# Patient Record
Sex: Female | Born: 1990 | Race: White | Hispanic: No | Marital: Single | Smoking: Current every day smoker
Health system: Southern US, Community
[De-identification: ages and names within clinical notes are randomized; demographics above are authoritative.]

## PROBLEM LIST (undated history)

## (undated) ENCOUNTER — Inpatient Hospital Stay (HOSPITAL_COMMUNITY): Payer: Self-pay

## (undated) DIAGNOSIS — E669 Obesity, unspecified: Secondary | ICD-10-CM

## (undated) DIAGNOSIS — A599 Trichomoniasis, unspecified: Secondary | ICD-10-CM

## (undated) DIAGNOSIS — F329 Major depressive disorder, single episode, unspecified: Secondary | ICD-10-CM

## (undated) DIAGNOSIS — E039 Hypothyroidism, unspecified: Secondary | ICD-10-CM

## (undated) DIAGNOSIS — B009 Herpesviral infection, unspecified: Secondary | ICD-10-CM

## (undated) DIAGNOSIS — F319 Bipolar disorder, unspecified: Secondary | ICD-10-CM

## (undated) DIAGNOSIS — F1910 Other psychoactive substance abuse, uncomplicated: Secondary | ICD-10-CM

## (undated) DIAGNOSIS — F419 Anxiety disorder, unspecified: Secondary | ICD-10-CM

## (undated) DIAGNOSIS — F909 Attention-deficit hyperactivity disorder, unspecified type: Secondary | ICD-10-CM

## (undated) DIAGNOSIS — G2581 Restless legs syndrome: Secondary | ICD-10-CM

## (undated) DIAGNOSIS — F32A Depression, unspecified: Secondary | ICD-10-CM

## (undated) DIAGNOSIS — A749 Chlamydial infection, unspecified: Secondary | ICD-10-CM

## (undated) DIAGNOSIS — Z9189 Other specified personal risk factors, not elsewhere classified: Secondary | ICD-10-CM

## (undated) DIAGNOSIS — M419 Scoliosis, unspecified: Secondary | ICD-10-CM

## (undated) DIAGNOSIS — E282 Polycystic ovarian syndrome: Secondary | ICD-10-CM

## (undated) HISTORY — PX: LAPAROSCOPIC ABDOMINAL EXPLORATION: SHX6249

---
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
## 2008-02-07 ENCOUNTER — Ambulatory Visit (HOSPITAL_COMMUNITY): Payer: Self-pay | Admitting: Psychiatry

## 2008-02-14 ENCOUNTER — Ambulatory Visit (HOSPITAL_COMMUNITY): Payer: Self-pay | Admitting: Psychiatry

## 2008-06-13 ENCOUNTER — Ambulatory Visit (HOSPITAL_COMMUNITY): Admission: RE | Admit: 2008-06-13 | Discharge: 2008-06-13 | Payer: Self-pay | Admitting: Obstetrics and Gynecology

## 2010-02-04 ENCOUNTER — Ambulatory Visit (HOSPITAL_COMMUNITY): Payer: Self-pay

## 2010-04-13 LAB — CBC
HCT: 41.3 % (ref 36.0–46.0)
Hemoglobin: 14.1 g/dL (ref 12.0–15.0)
MCHC: 34.2 g/dL (ref 30.0–36.0)
MCV: 84.7 fL (ref 78.0–100.0)
Platelets: 266 10*3/uL (ref 150–400)
RBC: 4.88 MIL/uL (ref 3.87–5.11)
RDW: 13.1 % (ref 11.5–15.5)
WBC: 10.4 10*3/uL (ref 4.0–10.5)

## 2010-04-13 LAB — DIFFERENTIAL
Eosinophils Absolute: 0.1 10*3/uL (ref 0.0–0.7)
Eosinophils Relative: 1 % (ref 0–5)
Lymphocytes Relative: 22 % (ref 12–46)
Lymphs Abs: 2.3 10*3/uL (ref 0.7–4.0)
Monocytes Absolute: 0.7 10*3/uL (ref 0.1–1.0)
Monocytes Relative: 7 % (ref 3–12)

## 2010-04-13 LAB — COMPREHENSIVE METABOLIC PANEL
ALT: 18 U/L (ref 0–35)
AST: 16 U/L (ref 0–37)
Albumin: 4.2 g/dL (ref 3.5–5.2)
CO2: 30 mEq/L (ref 19–32)
Calcium: 10.3 mg/dL (ref 8.4–10.5)
GFR calc Af Amer: >60 mL/min (ref >60)
Sodium: 140 mEq/L (ref 135–145)
Total Protein: 8.3 g/dL (ref 6.0–8.3)

## 2010-05-19 NOTE — H&P (Signed)
[name], Alyssa Hansen                ACCOUNT NO.:  [id number]   MEDICAL RECORD NO.:  [id number]          PATIENT TYPE:  INP   LOCATION:  9168                          FACILITY:  [facility]   PHYSICIAN:  Zenaida Niece, M.D.DATE OF BIRTH:  01/15/1990   DATE OF ADMISSION:  04/28/2007  DATE OF DISCHARGE:                              HISTORY & PHYSICAL   CHIEF COMPLAINT:  Gestational hypertension.   HISTORY OF PRESENT ILLNESS:  This is an 20 year old white female gravida  1, para 0 with an EGA of 39+ weeks by a 9-week ultrasound with a due  date of May 02, 2007 who presents for induction due to gestational  hypertension.  The patient was seen in the office on April 27, 2007.  At  that time, blood pressure is 152/80.  She had trace proteinuria.  She  has no significant symptoms of preeclampsia.  Cervical exam in the  office was 1+, 40, -2 to -3, and vertex presentation.  Due to her  gestational hypertension and borderline favorable cervix, I elected to  proceed with induction.  Prenatal care has been complicated by an E coli  UTI at 35 weeks treated with Macrobid.  She has been on Wellbutrin for  depression.  At 37 weeks, she reported that she may have been exposed to  herpes.  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  She was seen at 36 weeks, and at that time  blood pressure was 150/94 and trace proteinuria on dip stick.  Dr.  Ambrose Mantle ordered labs which were essentially normal, except for low  potassium of 3.1.  She was put on oral potassium approximately a week  later, and levels have continued to be on the low side.  She was  instructed by Dr. Ambrose Mantle to do a 24-hour urine collection but left it at  home so never brought this.  She has not had any significant proteinuria  on dip stick in the office.   ALLERGIES:  AMOXICILLIN AND CECLOR.  SHE HAS A RASH WITH AMOXICILLIN AND  EDEMA WITH CECLOR.   PRENATAL LABS:  Blood type is A positive with  a negative antibody  screen.  RPR nonreactive, rubella immune, hepatitis B surface antigen  negative, HIV negative, gonorrhea and chlamydia negative.  TFTs are  normal.  Initial HSV antibodies in December were negative.  1-hour  Glucola is 95.  Group B strep is positive, sensitive to erythromycin and  clindamycin.   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   PAST MEDICAL HISTORY:  1. History of hypothyroidism.  2. Depression.   PAST SURGICAL HISTORY:  None.   SOCIAL HISTORY:  She is single and does admit to a small amount of  smoking.   FAMILY HISTORY:  Noncontributory.   REVIEW OF SYSTEMS:  Normal complaints of pregnancy.   PHYSICAL EXAMINATION:  VITAL SIGNS:  Weight is 267 pounds.  Again, blood  pressure on April 27, 2007 is 152/80.  GENERAL:  This is an obese  white female in no acute distress.  NECK:  Supple without lymphadenopathy or thyromegaly.  LUNGS:  Clear to auscultation.  HEART:  Regular rate and rhythm without murmur.  ABDOMEN:  Gravid with fundal height of 39 cm and an estimated fetal  weight of 8 pounds.  EXTREMITIES:  1+ edema and nontender.  PELVIC:  Cervix, again on April 27, 2007 is 1+. 40, -2 to -3, in a  vertex presentation and an adequate pelvis.   ASSESSMENT:  1. Intrauterine pregnancy at 39 weeks with gestational hypertension      and a borderline favorable cervix.  2. Group B strep positive with allergies to amoxicillin and Ceclor.      She has a rash with amoxicillin and edema with Ceclor.  3. Hypokalemia.   PLAN:  Admit the patient and induce with Pitocin.  I will recheck PIH  labs.  She will be put on clindamycin for group B strep prophylaxis.  We  will replace potassium as needed.      Zenaida Niece, M.D.  Electronically Signed     TDM/MEDQ  D:  04/28/2007  T:  04/28/2007  Job:  [id number]

## 2010-05-19 NOTE — Op Note (Signed)
NAME:  Alyssa Hansen, Alyssa Hansen                ACCOUNT NO.:  [id number]   MEDICAL RECORD NO.:  [id number]          PATIENT TYPE:  AMB   LOCATION:  SDC                           [facility]ILITY:  [facility]   PHYSICIAN:  Malachi Pro. Ambrose Mantle, M.D. DATE OF BIRTH:  05-19-1990   DATE OF PROCEDURE:  06/13/2008  DATE OF DISCHARGE:                               OPERATIVE REPORT   PREOPERATIVE DIAGNOSIS:  Chronic pelvic pain.  The patient relates to  the cesarean section that she had approximately a year ago.   POSTOPERATIVE DIAGNOSES:  Chronic pelvic pain.  The patient relates to  the cesarean section that she had approximately a year ago.  Chronic  pelvic pain involved dyspareunia when she was having intercourse and  tenderness on pelvic exam.  Sexually transmitted disease checks had been  negative.  Ultrasound showed no pelvic masses, ovarian architecture  consistent with polycystic ovarian syndrome.   OPERATION:  Diagnostic laparoscopy.   OPERATOR:  Malachi Pro. Ambrose Mantle, MD   ASSISTANT:  Zenaida Niece, MD   ANESTHESIA:  General.   PROCEDURE IN DETAILS:  The patient was brought to the operating room,  placed under satisfactory general anesthesia and placed in [place]  stirrups.  The very obese abdomen was prepped with Betadine solution.  Vulva, vagina and urethra were prepped.  The bladder was emptied with a  [place] catheter.  Exam revealed the uterus to feel slightly mid plane,  probably normal size.  The adnexa were free of masses.  Cervix was  grasped and a Hulka cannula was placed into the uterus and attached the  anterior cervical lip.  The abdomen was then draped as a sterile field.  Because of the patient's quite obese abdomen, I elected to try enter the  abdominal cavities supraumbilically.  I made a transverse semilunar  incision above the umbilicus, carried in layers down to one I felt was  the fascia, incised that, got into the muscle and I was trying to enter  the abdominal cavity with my finger, but  could not do so.  I actually  asked Dr. Jackelyn Knife to come to the operating room to see if he could use  the OptiVu, but I did identify the fascia at this point and was able to  enter the peritoneal cavity.  I then used a Urology needle to  circumferentially suture the fascia.  I then placed the Hasson cannula  into the abdominal cavity, fixed the sutures to the grooves on the  cannula, and established a pneumoperitoneum.  There was not a perfect  seal, there was some leakage, but I was able to develop enough of a  pneumoperitoneum to see the pelvis clearly.  With the aid of  Trendelenburg position and manipulation of the uterus with the Hulka  cannula and placing an additional trocar suprapubically under direct  vision and using a blunt probe, I was able to visualize the pelvis in  its entirety.  Starting with the posterior cul-de-sac, there was no  evidence of any scarring or endometriosis.  There was no adherence of  any structure in the posterior cul-de-sac.  The uterus appeared  completely normal.  The anterior cul-de-sac was normal.  The right tube  and ovary were completely normal.  There was no adherence of the right  ovary to the posterior cul-de-sac or to any other structure.  The  fimbriated end of the right tube was completely normal.  The left  fimbria was normal as was the left ovary.  There was no adherence of  structures to the posterior broad ligament.  I looked superiorly.  The  liver edge was smooth.  I did not see the gallbladder.  I did not see  any upper abdominal abnormalities.  I removed the lower abdominal trocar  from the site and saw no bleeding.  I then released the Hulka cannula  and then closed these sutures that had been circumferentially through  the fascia and then I put an additional figure-of-eight suture of 0  Vicryl through the fascia.  I then closed the subcu tissue with 3-0  Vicryl and closed the skin with interrupted sutures  of 3-0 plain catgut.   The lower abdominal site was reapproximated with  Steri-Strips.  The patient seemed to tolerate the procedure well.  Blood  loss was about 25 mL.  Sponge and needle counts were correct.  The Hulka  cannula was removed and the patient was returned to recovery in  satisfactory condition.      Malachi Pro. Ambrose Mantle, M.D.  Electronically Signed     TFH/MEDQ  D:  06/13/2008  T:  06/13/2008  Job:  [id number]

## 2010-05-19 NOTE — Op Note (Signed)
Alyssa Hansen, Alyssa Hansen                ACCOUNT NO.:  [id number]   MEDICAL RECORD NO.:  [id number]          PATIENT TYPE:  INP   LOCATION:  9168                          [facility]ILITY:  [facility]   PHYSICIAN:  Leighton Roach Meisinger, M.D.DATE OF BIRTH:  07-01-1990   DATE OF PROCEDURE:  04/29/2007  DATE OF DISCHARGE:                               OPERATIVE REPORT   PREOPERATIVE DIAGNOSES:  Intrauterine pregnancy at 39 weeks, gestational  hypertension, hypokalemia and arrest of dilation.   POSTOPERATIVE DIAGNOSES:  Intrauterine pregnancy at 39 weeks,  gestational hypertension, hypokalemia and arrest of dilation.   SURGERY:  Primary low transverse cesarean section without extensions.   SURGEON:  Zenaida Niece, M.D.   ANESTHESIA:  Epidural.   SPECIMENS:  Placenta sent to pathology.   ESTIMATED BLOOD LOSS:  1000 mL.   FINDINGS:  The patient had normal gravid anatomy.   COMPLICATIONS:  None.   PROCEDURE IN DETAIL:  The patient was taken back to the operating room  and placed in the dorsal supine position with left lateral tilt.  Her  previously placed epidural was dosed appropriately.  The abdomen was  prepped and draped in the usual sterile fashion and a Foley catheter had  previously been placed.  The level of his anesthesia was found to be  adequate and the abdomen was entered via a standard Pfannenstiel  incision.  Once the peritoneal cavity was entered, the Alexis disposable  self-retaining retractor was placed and the lower uterine segment was  well exposed.  A 4 cm transverse incision was made in the lower uterine  segment, pushing the bladder inferior.  Once the uterus was entered, the  incision was extended digitally. Membranes were ruptured revealing  meconium stained amniotic fluid.  The fetal vertex was grasped and  delivered through the incision atraumatically.  Mouth and nares were  suctioned with the bulb.  The remainder of the infant then delivered  atraumatically.  Cord was  doubly clamped and cut and the infant handed  to the awaiting pediatric team.  Placenta was then delivered  spontaneously and sent for cord blood collection and then to pathology.  The uterus was wiped dry with a clean lap pad and all clots and debris  removed.  Uterine incision was inspected  and found to be free of  extensions.  Uterine incision was closed in 2 layers, with the first  layer being in a running locking layer with #1 chromic and the second  layer being an imbricating layer also with #1 chromic.  A small amount  of bleeding from the right side was controlled with another figure-of-  eight suture of #1 chromic.  Tubes and ovaries were inspected and found  to be normal.  Pericolic gutters were blotted and all clots and debris  removed.  Uterine incision was again inspected and found to be  hemostatic.  Subfascial space was then irrigated and made hemostatic  with electrocautery.  The fascia was closed in a running fashion  starting at both ends and meeting in the middle with 0 Vicryl.  Subcutaneous tissue was irrigated and made  hemostatic with  electrocautery.  Subcutaneous tissue was then closed with running 2-0  plain gut suture.  The skin  was then closed with staples followed by a sterile dressing.  The  patient tolerated the procedure well and was taken to the recovery room  in stable condition.  Counts were correct x2 and she received  clindamycin 900 mg IV at the beginning of the procedure.      Zenaida Niece, M.D.  Electronically Signed     TDM/MEDQ  D:  04/29/2007  T:  04/29/2007  Job:  [id number]

## 2010-05-19 NOTE — Discharge Summary (Signed)
Alyssa Hansen, Alyssa Hansen                ACCOUNT NO.:  [id number]   MEDICAL RECORD NO.:  [id number]          PATIENT TYPE:  INP   LOCATION:  9121                          [facility]   PHYSICIAN:  Zenaida Niece, M.D.DATE OF BIRTH:  [birth year]   DATE OF ADMISSION:  04/28/2007  DATE OF DISCHARGE:  05/02/2007                               DISCHARGE SUMMARY   ADMISSION DIAGNOSES:  Intrauterine pregnancy at 39 weeks, gestational  hypertension, group B strep carrier, and hypokalemia.   DISCHARGE DIAGNOSES:  Intrauterine pregnancy at 39 weeks, gestational  hypertension, group B strep carrier, hypokalemia, postpartum anemia, and  arrest of dilation.   HISTORY AND PHYSICAL:  Please see chart for full history and physical.  Briefly, this is an 20 year old white female, gravida 1, para 0, with an  EGA of [redacted] weeks, who was admitted for induction due to gestational  hypertension.   PAST MEDICAL HISTORY:  Significant for history of hypothyroidism and  depression.   ALLERGIES:  She is allergic to AMPICILLIN and CECLOR and her group B  strep is sensitive to CLINDAMYCIN and ERYTHROMYCIN.   PHYSICAL EXAMINATION:  Weight is 267 pounds and blood pressure is  152/80.  ABDOMEN:  Obese, gravid with a fundal height of 39 cm.  Cervix is 1+,  40, -2, -3, vertex presentation.   HOSPITAL COURSE:  The patient was admitted and initially started on  Pitocin.  She was also given IV potassium and put on clindamycin for  group B strep prophylaxis.  Throughout the day, on April 24 , 2009, she  did develop some contractions, but cervix did not change.  On the  evening of April 28, 2007, the Pitocin was turned off and she had the  Cytotec placed and was given Ambien.  On the morning of April 29, 2007,  cervix was 1+, 50, -2, and -3 and I was able to rupture membranes, which  revealed moderate to thick meconium.  Potassium on April 29, 2007, was  3.0.  She was given more IV potassium, restarted on Pitocin, and  continued on clindamycin.  She progressed to 4 cm throughout the day,  but by 8:45 in the evening had not progressed past 4 cm and had been 4  cm for 5-6 hours.  Thus, on the evening of April 29, 2007, she had a  primary low-transverse cesarean section under epidural anesthesia.  Estimated blood loss was 1000 mL.  She delivered a viable female with  Apgars of 9 and 9 that weighed 7 pounds.  Postoperatively, she had no  significant complications.  Pre-delivery hemoglobin was 9.4 and on  postop day #1 is 7.0.  Potassium on postpartum day #1 was 3.2.  She was  continued on p.o. potassium.  On postpartum day #2, hemoglobin fell to  6.5 and potassium was stable at 3.1.  On postop day #3, hemoglobin was  stable at 6.7 and potassium had increased to 3.5 on p.o.  supplementation.  Blood pressures remained stable after delivery.  On  postop day #3, she was felt to be stable enough for discharge home.  At  this time, her incision was healing well and her staples were removed  and Steri-Strips was applied.   DISCHARGE INSTRUCTIONS:  Regular diet, pelvic rest, and no strenuous  activity.   FOLLOWUP:  Followup is in 2 weeks for an incision check.   MEDICATIONS:  K-Dur 20 mEq b.i.d. until the prescription she has at home  has gone, over-the-counter iron b.i.d., Percocet #40 1 to 2 p.o. q.4-6  h. p.r.n. pain, and over-the-counter ibuprofen as needed.  She was also  given our discharge pamphlet.      Zenaida Niece, M.D.  Electronically Signed     TDM/MEDQ  D:  05/02/2007  T:  05/03/2007  Job:  [id number]

## 2010-05-22 NOTE — Discharge Summary (Signed)
Alyssa Hansen, FRARY NO.:  [id number]   MEDICAL RECORD NO.:  [id number]          PATIENT TYPE:  INP   LOCATION:  0101                          [facility]ILITY:  [facility]   PHYSICIAN:  Lalla Brothers, MDDATE OF BIRTH:  [birth year]   DATE OF ADMISSION:  02/15/2006  DATE OF DISCHARGE:  02/20/2006                               DISCHARGE SUMMARY   IDENTIFICATION:  A 50-1/22-year-old female 12th grade student at  [employer] was admitted emergently voluntarily when  brought by mother for inpatient stabilization and treatment of suicide  risk and depression.  The patient has been missing school sometimes  because of intoxication with cannabis and was getting behind on her  work.  She became upset at school after not being able to take a test  because she did not attend to the material, that she tends to blame  mother even though she skipped school the day before.  She sees Dr.  Lucianne Muss for psychiatric care and Gretta Arab at [facility].  For full details, please see the typed admission assessment.   SYNOPSIS OF PRESENT ILLNESS:  The patient is angry on admission and the  mother seemed fearful that the patient would hurt mother.  Patient is  threatening to overdose.  She has long history of ADHD and interprets  herself that she has bipolar disorder currently.  She reports a blunt of  cannabis twice weekly at the time of admission and a pint of liquor  every 3-4 days as well as a pack per day of cigarettes.  She is allergic  to AMOXICILLIN and CECLOR.  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  At the time of admission, she is taking metformin 1500 mg XR  daily, Levoxyl 0.5 mg daily, Concerta 36 mg daily, Wellbutrin 150 mg XL  daily and trazodone 50 mg at bedtime.  She sleeps excessively and is  irritable and manipulative, demanding relief that she states can only  be  obtained by released from the hospital.  She may be missing up to 3 days  of school weekly and may have legal consequences soon.   INITIAL MENTAL STATUS EXAM:  The patient was hypersensitive to the  comments or actions of others and over reacts in anger as well as by  overeating with poor impulse control.  She projects responsibility to  mother, stating that she considers mother depressed and suicidal  herself.  There are no psychotic or manic symptoms.  She has no post-  traumatic dissociation or re-experiencing.  She has a suicide plan to  overdose but denies homicidality and she minimizes the suicidality as  though it is mother's perception.   LABORATORY FINDINGS:  CBC on admission was normal with white count 9400,  hemoglobin 12.7, MCV of 83 and platelet count 230,000.  Basic metabolic  panel was normal except BUN low at 4 with lower limit of normal 6.  Sodium was normal at 137, potassium 3.6, fasting glucose 81, creatinine  0.64 and calcium 9.  Free  T4 was normal at 1.18 and TSH at 2.945.  Hepatic function panel was normal except albumin low at 3.1 with lower  limit of normal 3.5 with GGT normal at 32, SGOT 18 and SGPT 19.  RPR and  HIV were nonreactive.  Urine HCG was negative.  Urinalysis was normal  with specific gravity of 1.018 with moderate leukocyte esterase, 3-6  WBC, 0-2 RBC, few bacteria and many epithelial suggesting a poor clean  catch.  Urine probe for gonorrhea and chlamydia trichomatous by DNA  amplification were both negative.  Urine drug screen was positive for  marijuana metabolite and benzodiazepine with creatinine of 148 mg/dL  documenting adequate specimen with confirmation and quantitation of  marijuana at 28 ng/mL and benzodiazepine as alphahydroxy alprazolam at  470 ng/mL confirming that the patient had taken Xanax.  The patient's  capillary blood glucose monitoring for 48 hours was normal with maximum  value of 118 just before supper.  On the day of  February 18, 2006, her  fasting CBG was 86 and the preceding day, her a.c. lunch was 85, a.c.  supper 81 and bedtime CBG 83 mg/dL.   HOSPITAL COURSE AND TREATMENT:  General medical exam by Jorje Guild PA-C  noted one-pack per day of cigarettes for 4 years and the patient  acknowledged using Xanax twice.  Has a paternal uncle with  schizophrenia.  The patient had menarche at age 51 with a regular menses  and now diagnosis of polycystic ovaries.  She is overweight.  Last GYN  exam was September 2007.  She had fallen prior to admission with a  contusion and slight skin tear to the upper lip treated with ice pack.  She is allergic to CECLOR and AMOXICILLIN.  Vital signs were otherwise  normal throughout hospital stay.  Admission height was 64-1/2 inches and  weight 112 kg.  Initial blood pressure was 116/75 with heart rate of 92.  At the time of discharge, supine blood pressure was 119/68 with heart  rate of 88 and standing blood pressure 153/77 with heart rate of 25.  On  the day prior to discharge, supine blood pressure was 126/74 with heart  rate of 91 and standing blood pressure 118/76 with heart rate of 131.  The patient was initially resistant to participation in treatment for  the first two hospital days.  She joined in somewhat subsequently while  maintaining that she did not need to be in the hospital and that she her  mother wanted her discharged.  Mother did sign a parental demand for  discharge stating that mother considers the patient to have adequate  outpatient treatment to handle current symptoms and does not need to be  in the hospital.  The patient did participate in the hospital treatment  program though denying that she was learning anything.  She maintained  that she is not suicidal but only depressed.  She suggests that her  depression gets worse in her teenage years.  She was discharged 3 days early at mother's demand, taking every effort possible to prepare she  and  mother for discharge.  She required no seclusion or restraint during  the hospital stay.  She had missed 23 days of school last semester  requiring modifications and now is decompensating again over the last 2  weeks.  Expectation for return to school was advanced and preparations  made for such.   FINAL DIAGNOSES:  AXIS I:  1. Major depression, recurrent, severe with atypical features.  2. Attention deficit hyperactivity disorder, combined type, moderate      severity.  3. Cannabis abuse.  4. Alcohol abuse.  5. Parent/child problem.  6. Other specified family circumstances.  7. Other interpersonal problems.  AXIS II: Diagnosis deferred.  AXIS III:  1. Obesity.  2. Polycystic ovarian syndrome.  3. Hypothyroidism.  4. Allergy to amoxicillin and Ceclor.  5. Cigarette smoking  6. Contusion and abrasion upper lip.  AXIS IV: Stressors family severe acute and chronic; phase of life severe  acute and chronic; school moderate acute and chronic; medical moderate  acute and chronic.  AXIS V: GAF on admission 37 with highest in last year 63 and discharge  GAF was 47.   PLAN:  The patient was discharged on her weight control metabolic  syndrome diet having no restrictions on physical activity other than to  abstain from cannabis, alcohol and other illicit drugs.  Lips are well  healed.  She has no wound care instructions.  Crisis and safety plans  are outlined if needed.  She is discharged on the following medication:  1. Concerta 36 mg every morning a month's supply.  2. Wellbutrin increased to 300 mg XL every morning prescribed a      month's supply.  3. Trazodone 50 mg every bedtime a month's supply prescribed.  4. Levothyroxine 15 mcg daily having her own home supply.  5. Metformin 1500 mg XL daily having her own home supply.   They were re-educated on medications.  She will see Dr. Lucianne Muss February 23, 2006 at 1400 for psychiatric follow-up.  She will see Gretta Arab  February 23, 2006 at 1500 for therapy.      Lalla Brothers, MD  Electronically Signed     GEJ/MEDQ  D:  02/26/2006  T:  02/27/2006  Job:  [id number]   cc:   Reather Littler, M.D.  Fax: [phone]   Gretta Arab  [facility]  [address]  [place], [place] [address]  Fax [phone]

## 2010-05-22 NOTE — H&P (Signed)
Alyssa Hansen, Alyssa Hansen NO.:  [id number]   MEDICAL RECORD NO.:  [id number]          PATIENT TYPE:  INP   LOCATION:  0101                          FACILITY:  BH   PHYSICIAN:  Lalla Brothers, MDDATE OF BIRTH:  Nov 28, 1990   DATE OF ADMISSION:  02/15/2006  DATE OF DISCHARGE:                       PSYCHIATRIC ADMISSION ASSESSMENT   IDENTIFICATION:  This 57-1/20-year-old female, 12th grade student at  [employer], is admitted emergently voluntarily when  brought by mother for inpatient stabilization and treatment of suicide  risk and depression.  Mother documented that the patient made suicide  statements to overdose but did not do so.  The patient has been sad and  angry with progressive consequences as she gets behind at school and  blames mother or others.  The patient suggested that it was four weeks  ago that she was using cannabis at home and mother is concerned about  intoxication.   HISTORY OF PRESENT ILLNESS:  The patient has seen Dr. Betti Cruz at [facility] apparently in October of 2007.  He was  subsequently transferred to Dr. Lucianne Muss and seen her for one visit.  Since  October of 2007, the patient has been in therapy with Gretta Arab, [phone], every other week for therapy.  The patient offers little  formulation of the origin and sustaining factors for symptoms.  She  suggests she has only a few friends and that some are supportive or  helpful though few.  The patient states the school thought she was sick  on the day of admission.  She had skipped school the day before and was  attempting to avoid the test she was to take on the day of admission as  she knew none of the answers to questions.  The patient cries as she  states that these problems are not of much significance.  She is  underachieving in school, particularly last year, having to have a  shortened school day but barely passing.  The patient does not  believe  that the school suspected intoxication such as with cannabis.  Mother  states that she is afraid the patient will harm herself at the time of  crisis assessment.  The patient is angry that mother seemed fearful that  the patient would hurt mother.  The patient smokes one pack per day of  cigarettes.  She has a blunt of cannabis twice weekly and reports having  a pint of liquor every 3-4 days with last use of cannabis two weeks ago  and alcohol last weekend.  She smokes one pack per day of cigarettes.  The patient denies organic central nervous system trauma.  She does not  acknowledge psychic trauma and post-traumatic stress.  She does not  describe dissociation.  The patient does not describe definite manic  symptoms but she suggests that she needs assess for a manic diathesis.   PAST MEDICAL HISTORY:  The patient is under the primary care of Dr. Elpidio Eric.  The patient reports having hypothyroidism and borderline  diabetes mellitus.  She reports having polycystic ovary  syndrome.  Last  GYN exam however was one year ago.  She reports abrasions and edema on  both feet.  The patient reports being sexually active but is uncertain  of last menses.  She is allergic to AMOXICILLIN and CECLOR.  She denies  seizure or syncope.  She denies heart murmur or arrhythmia.   REVIEW OF SYSTEMS:  The patient denies difficulty with gait, gaze or  continence at this time.  She denies exposure to communicable disease or  toxins.  She has no current headache or sensory loss.  There is no  memory loss or coordination deficit.  There is no rash, jaundice or  purpura currently.  There is no abdominal pain, nausea, vomiting or  diarrhea.  There is no dysuria or arthralgia.   IMMUNIZATIONS:  Up-to-date.   FAMILY HISTORY:  The patient resides with mother whom the patient  describes as depressed and making suicide threats.  There is an uncle  with schizophrenia.  The patient will not disclose  more about the family  history.  She has a long history of ADHD symptoms and now believes that  she has bipolar disorder in the course of her care thus far.  She offers  no other elaboration on the symptoms of the uncle with schizophrenia.   SOCIAL AND DEVELOPMENTAL HISTORY:  The patient is a 12th grade student  at [employer].  She walked out of class,  refusing to do her test or go back on the day of admission.  She has  excessive sleep and overall atypical depressive features.  She is  irritable and manipulative, demanding relief and suggesting relief can  only be obtained by release from the hospital.  The patient is  circumstantial and self-defeating.  She denies legal consequences at  this time.  She is apparently missing about three days of school weekly  and therefore likely to have legal consequences soon.  She was  apparently given a reduced curriculum last year in order to pass at  school.  She does not acknowledge other employment or extracurricular  activities.  She seems enmeshed with mother but at the same time  defeating of both.  She acknowledges sexual activity.   ASSETS:  The patient verbally participates until she begins to make  threats.   MENTAL STATUS EXAM:  Height is 64-1/2 inches and weight is 112 kg.  Blood pressure is 116/75 with heart rate of 92.  She is right-handed.  She is alert and oriented with speech intact.  Cranial nerves are  intact.  Muscle strengths and tone are normal.  There are no pathologic  reflexes or soft neurologic findings.  There are no abnormal involuntary  movements.  Gait and gaze are intact.  Mood is moderately to severely  dysphoric with atypical and reactive dysphoric features.  The patient is  hypersensitive to the comments or actions of others and overreacts with  poor impulse control.  She appears to overeat and have outbursts of anger.  She approaches hospitalization in the same way, projecting   responsibility to mother as though mother is depressed and suicidal.  The patient minimizes her destructiveness and difficulty accepting  communication.  She becomes more depressed and angry as progressive  consequences occur in a self-defeating and self-sustaining pattern.  She  does not manifest psychotic, manic or post-traumatic dissociative or  reexperiencing symptoms at this time.  She has suicidal ideation and a  plan to overdose.  She is not homicidal.  ADMISSION MEDICATIONS:  1. Wellbutrin 150 mg XL every morning.  2. Concerta 36 mg every morning.  3. Trazodone 50 mg at bedtime if needed for insomnia.  4. Levoxyl 0.05 mg every morning.  5. Metformin 1500 mg ER every morning.   IMPRESSION:  AXIS I:  Major depression, recurrent, severe with atypical  features.  Attention-deficit hyperactivity disorder, combined-type,  moderate severity.  Cannabis abuse.  Alcohol abuse.  Rule out  generalized anxiety disorder (provisional diagnosis).  Other  interpersonal problem.  Parent-child problem.  Other specified family  circumstances.  AXIS II:  Diagnosis deferred.  AXIS III:  Obesity, history of polycystic ovaries disorder by history,  hypothyroidism by history, prediabetes mellitus by history, allergy to  AMOXICILLIN and CECLOR, cigarette smoking.  AXIS IV:  Stressors:  Family--severe, acute and chronic; phase of life--  severe, acute and chronic; school--moderate, acute and chronic; medical-  -moderate, acute and chronic.  AXIS V:  GAF on admission 37; highest in last year estimated at 63.   PLAN:  The patient is admitted for inpatient adolescent psychiatric and  multidisciplinary multimodal behavioral health treatment in a team-based  program at a locked psychiatric unit.  Will increase Wellbutrin to 300  mg XL every morning and continue Concerta 36 mg every morning and as  needed trazodone at bedtime.  Cognitive behavioral therapy, anger  management, family therapy, substance  abuse intervention, social and  communication skills, problem-solving and coping skills, habit reversal,  individuation separation and identity consolidation as well as learning  strategies can be undertaken.   ESTIMATED LENGTH OF STAY:  Seven days with target symptoms for discharge  being stabilization of suicide risk and mood, stabilization of  disruptive behavior, dangerous to self and others and generalization of  the capacity for safe, effective participation in outpatient treatment.      Lalla Brothers, MD  Electronically Signed     GEJ/MEDQ  D:  02/16/2006  T:  02/16/2006  Job:  [id number]

## 2010-09-29 LAB — CBC
HCT: 19.8 — ABNORMAL LOW
HCT: 20 — ABNORMAL LOW
HCT: 26.4 — ABNORMAL LOW
HCT: 27.1 — ABNORMAL LOW
HCT: 28 — ABNORMAL LOW
Hemoglobin: 6.5 — CL
Hemoglobin: 7 — CL
Hemoglobin: 9.4 — ABNORMAL LOW
Hemoglobin: 9.6 — ABNORMAL LOW
MCHC: 32.4
MCHC: 33.5
MCHC: 33.6
MCHC: 34.6
MCV: 78.4
MCV: 78.4
MCV: 79.3
Platelets: 246
Platelets: 252
Platelets: 267
Platelets: 268
RBC: 2.48 — ABNORMAL LOW
RBC: 2.5 — ABNORMAL LOW
RDW: 13.9
RDW: 14.3
RDW: 14.4
RDW: 14.4
WBC: 13.6 — ABNORMAL HIGH
WBC: 17 — ABNORMAL HIGH

## 2010-09-29 LAB — COMPREHENSIVE METABOLIC PANEL
ALT: 8
ALT: <8
AST: 10
Albumin: 1.5 — ABNORMAL LOW
Albumin: 1.9 — ABNORMAL LOW
Albumin: 2 — ABNORMAL LOW
Alkaline Phosphatase: 123 — ABNORMAL HIGH
Alkaline Phosphatase: 131 — ABNORMAL HIGH
BUN: 2 — ABNORMAL LOW
BUN: 4 — ABNORMAL LOW
Calcium: 7.9 — ABNORMAL LOW
Chloride: 107
Creatinine, Ser: 0.55
GFR calc Af Amer: >60
Glucose, Bld: 120 — ABNORMAL HIGH
Glucose, Bld: 81
Potassium: 3 — ABNORMAL LOW
Potassium: 3.2 — ABNORMAL LOW
Sodium: 137
Sodium: 139
Total Bilirubin: 0.2 — ABNORMAL LOW
Total Bilirubin: 0.3
Total Protein: 4.1 — ABNORMAL LOW
Total Protein: 5.2 — ABNORMAL LOW
Total Protein: 5.5 — ABNORMAL LOW

## 2010-09-29 LAB — URIC ACID
Uric Acid, Serum: 4.8
Uric Acid, Serum: 5
Uric Acid, Serum: 5.4

## 2010-09-29 LAB — URINALYSIS, DIPSTICK ONLY
Glucose, UA: NEGATIVE
Ketones, ur: NEGATIVE
Nitrite: POSITIVE — AB
Protein, ur: NEGATIVE

## 2010-09-29 LAB — RPR: RPR Ser Ql: NONREACTIVE

## 2010-09-29 LAB — LACTATE DEHYDROGENASE
LDH: 139
LDH: 83 — ABNORMAL LOW

## 2011-02-05 ENCOUNTER — Other Ambulatory Visit: Payer: Self-pay | Admitting: Gynecology

## 2012-02-11 ENCOUNTER — Other Ambulatory Visit: Payer: Self-pay

## 2012-04-14 ENCOUNTER — Encounter (HOSPITAL_COMMUNITY): Payer: Self-pay

## 2012-04-14 ENCOUNTER — Inpatient Hospital Stay (HOSPITAL_COMMUNITY)
Admission: AD | Admit: 2012-04-14 | Discharge: 2012-04-15 | Disposition: A | Discharge status: Other Acute Inpatient Hospital | Payer: BC Managed Care – PPO | Source: Ambulatory Visit | Attending: Obstetrics and Gynecology | Admitting: Obstetrics and Gynecology

## 2012-04-14 DIAGNOSIS — F1920 Other psychoactive substance dependence, uncomplicated: Secondary | ICD-10-CM | POA: Insufficient documentation

## 2012-04-14 DIAGNOSIS — F1120 Opioid dependence, uncomplicated: Secondary | ICD-10-CM | POA: Insufficient documentation

## 2012-04-14 HISTORY — DX: Restless legs syndrome: G25.81

## 2012-04-14 HISTORY — DX: Scoliosis, unspecified: M41.9

## 2012-04-14 HISTORY — DX: Major depressive disorder, single episode, unspecified: F32.9

## 2012-04-14 HISTORY — DX: Anxiety disorder, unspecified: F41.9

## 2012-04-14 HISTORY — DX: Attention-deficit hyperactivity disorder, unspecified type: F90.9

## 2012-04-14 HISTORY — DX: Depression, unspecified: F32.A

## 2012-04-14 HISTORY — DX: Bipolar disorder, unspecified: F31.9

## 2012-04-14 LAB — URINALYSIS, ROUTINE W REFLEX MICROSCOPIC
Bilirubin Urine: NEGATIVE
Glucose, UA: NEGATIVE mg/dL
Hgb urine dipstick: NEGATIVE
Specific Gravity, Urine: >1.03 — ABNORMAL HIGH (ref 1.005–1.030)
pH: 6 (ref 5.0–8.0)

## 2012-04-14 LAB — COMPREHENSIVE METABOLIC PANEL
AST: 24 U/L (ref 0–37)
CO2: 28 mEq/L (ref 19–32)
Calcium: 9.4 mg/dL (ref 8.4–10.5)
Chloride: 101 mEq/L (ref 96–112)
Creatinine, Ser: 0.62 mg/dL (ref 0.50–1.10)
GFR calc Af Amer: >90 mL/min (ref >90)
GFR calc non Af Amer: >90 mL/min (ref >90)
Glucose, Bld: 104 mg/dL — ABNORMAL HIGH (ref 70–99)
Total Bilirubin: 0.2 mg/dL — ABNORMAL LOW (ref 0.3–1.2)

## 2012-04-14 LAB — CBC
HCT: 34.6 % — ABNORMAL LOW (ref 36.0–46.0)
Hemoglobin: 11.5 g/dL — ABNORMAL LOW (ref 12.0–15.0)
MCH: 27.3 pg (ref 26.0–34.0)
MCV: 82.2 fL (ref 78.0–100.0)
RBC: 4.21 MIL/uL (ref 3.87–5.11)
WBC: 10.9 10*3/uL — ABNORMAL HIGH (ref 4.0–10.5)

## 2012-04-14 LAB — RAPID URINE DRUG SCREEN, HOSP PERFORMED: Opiates: POSITIVE — AB

## 2012-04-14 NOTE — MAU Provider Note (Addendum)
History     CSN: [id number]  Arrival date and time: 04/14/12 1707   None     No chief complaint on file.  HPI 22 y.o. G2P1001 at [redacted]w[redacted]d requesting detox from oxycodone. "trying to get off of it for 5 years". Has been injecting between 60 and 200 mg oxycodone daily. Also snorts pills. Not using alcohol. Does smoke cigarettes. States she is ready to stop using.   Past Medical History  Diagnosis Date  . Scoliosis   . Restless legs   . Bipolar 1 disorder, depressed   . Anxiety   . Depression   . ADHD (attention deficit hyperactivity disorder)   . ADHD (attention deficit hyperactivity disorder)     Past Surgical History  Procedure Laterality Date  . Cesarean section    . Laparoscopic abdominal exploration      Family History  Problem Relation Age of Onset  . Alcohol abuse Neg Hx   . Arthritis Neg Hx   . Asthma Neg Hx   . Birth defects Neg Hx   . Cancer Neg Hx   . Depression Neg Hx   . COPD Neg Hx   . Drug abuse Neg Hx   . Early death Neg Hx   . Diabetes Neg Hx   . Hearing loss Neg Hx     History  Substance Use Topics  . Smoking status: Current Every Day Smoker  . Smokeless tobacco: Not on file  . Alcohol Use: No    Allergies:  Allergies  Allergen Reactions  . Amoxicillin   . Cefaclor     No prescriptions prior to admission    Review of Systems  Constitutional: Negative.   Respiratory: Negative.   Cardiovascular: Negative.   Gastrointestinal: Negative for nausea, vomiting, abdominal pain, diarrhea and constipation.  Genitourinary: Negative for dysuria, urgency, frequency, hematuria and flank pain.       Negative for vaginal bleeding, cramping/contractions  Musculoskeletal: Negative.   Neurological: Negative.   Psychiatric/Behavioral: Negative.    Physical Exam   Blood pressure 122/65, pulse 90, temperature 97.9 F (36.6 C), temperature source Oral, resp. rate 20, height 5\' 4"  (1.626 m), weight 282 lb 3.2 oz (128.005 kg), SpO2 96.00%.  Physical  Exam  Nursing note and vitals reviewed. Constitutional: She is oriented to person, place, and time. She appears well-developed and well-nourished. No distress.  Obese   Cardiovascular: Normal rate.   Respiratory: Effort normal.  GI: Soft. There is no tenderness.  Musculoskeletal: Normal range of motion.  Neurological: She is alert and oriented to person, place, and time.  Skin: Skin is warm and dry.  Multiple injection sites on arms and on right breast, none with discharge or erythema  Psychiatric: She has a normal mood and affect. Her behavior is normal.    MAU Course  Procedures  Results for orders placed during the hospital encounter of 04/14/12 (from the past 24 hour(s))  URINE RAPID DRUG SCREEN (HOSP PERFORMED)     Status: Abnormal   Collection Time    04/14/12  5:15 PM      Result Value Range   Opiates POSITIVE (*) NONE DETECTED   Cocaine POSITIVE (*) NONE DETECTED   Benzodiazepines NONE DETECTED  NONE DETECTED   Amphetamines NONE DETECTED  NONE DETECTED   Tetrahydrocannabinol NONE DETECTED  NONE DETECTED   Barbiturates NONE DETECTED  NONE DETECTED  URINALYSIS, ROUTINE W REFLEX MICROSCOPIC     Status: Abnormal   Collection Time    04/14/12  5:59 PM  Result Value Range   Color, Urine YELLOW  YELLOW   APPearance HAZY (*) CLEAR   Specific Gravity, Urine >1.030 (*) 1.005 - 1.030   pH 6.0  5.0 - 8.0   Glucose, UA NEGATIVE  NEGATIVE mg/dL   Hgb urine dipstick NEGATIVE  NEGATIVE   Bilirubin Urine NEGATIVE  NEGATIVE   Ketones, ur NEGATIVE  NEGATIVE mg/dL   Protein, ur NEGATIVE  NEGATIVE mg/dL   Urobilinogen, UA 0.2  0.0 - 1.0 mg/dL   Nitrite NEGATIVE  NEGATIVE   Leukocytes, UA NEGATIVE  NEGATIVE  CBC     Status: Abnormal   Collection Time    04/14/12 10:26 PM      Result Value Range   WBC 10.9 (*) 4.0 - 10.5 K/uL   RBC 4.21  3.87 - 5.11 MIL/uL   Hemoglobin 11.5 (*) 12.0 - 15.0 g/dL   HCT 40.9 (*) 81.1 - 91.4 %   MCV 82.2  78.0 - 100.0 fL   MCH 27.3  26.0 - 34.0  pg   MCHC 33.2  30.0 - 36.0 g/dL   RDW 78.2  95.6 - 21.3 %   Platelets 193  150 - 400 K/uL  COMPREHENSIVE METABOLIC PANEL     Status: Abnormal   Collection Time    04/14/12 10:26 PM      Result Value Range   Sodium 138  135 - 145 mEq/L   Potassium 3.4 (*) 3.5 - 5.1 mEq/L   Chloride 101  96 - 112 mEq/L   CO2 28  19 - 32 mEq/L   Glucose, Bld 104 (*) 70 - 99 mg/dL   BUN 5 (*) 6 - 23 mg/dL   Creatinine, Ser 0.86  0.50 - 1.10 mg/dL   Calcium 9.4  8.4 - 57.8 mg/dL   Total Protein 6.3  6.0 - 8.3 g/dL   Albumin 2.5 (*) 3.5 - 5.2 g/dL   AST 24  0 - 37 U/L   ALT 16  0 - 35 U/L   Alkaline Phosphatase 63  39 - 117 U/L   Total Bilirubin 0.2 (*) 0.3 - 1.2 mg/dL   GFR calc non Af Amer >90  >90 mL/min   GFR calc Af Amer >90  >90 mL/min     Assessment and Plan  SW consult ordered Dr. Ellyn Hack consulted psychiatry at [facility] - they are sending ACT team to eval if patient meets criteria for inpatient detox Care assumed by Dorathy Kinsman, CNM  [facility] 04/14/2012, 6:06 PM   Ivonne Andrew, CNM assumed care of pt at 2000. Awaiting ACT eval.   MAU Course  2200: Dr. Ellyn Hack at [facility]. Pt requesting [place]. Informed her that there is no emergent condition requiring one. Anatomy scan will be scheduled.  0200: RN informed by ACT that pt will be accepted at [facility], but not until 0330 or 0400.  0400: [facility] (w/ ACT) on unit arranging transfer. Dr. Ellyn Hack notified.   Assessment:  Opiate dependence, desires detox. 18.2 week pregnancy  Plan: Transfer to [facility]. Hilary Hertz MD accepting physician. Daily dopplers to be performed by [facility] RN [phone])  Dorathy Kinsman, CNM 04/15/2012 5:19 AM

## 2012-04-14 NOTE — BH Assessment (Addendum)
Assessment Note   Patient is a 22 year old white female that is [redacted] weeks pregnant.  Patient requests detox from oxycodone.  Patient last use was today when she shot up and snorted 6 (30mg ) roxycodone earlier today.  Patient reports that she has been using daily for month and a half. Patient reports she has been using through her pregnancy but within the last month and a half her usage has increased to a daily basis.  Patient reports that she uses between 60 - 200 mg daily.  Patient reports her first usage was when she was 22 years old and she was immediately addicted.  Patient reports that her longest period of sobriety was from March 20, 2008 until February 05, 2009.  Patient reports that she is currently receiving after care services with a substance abuse therapist Josaphine Katrinka Blazing in [place] that she meets with once a week for group therapy and once a week for individual therapy.   Patient denies any current withdrawal symptoms.  Patient reports incidents of blacking out when she uses.    Patient reports that she has been very depressed with her life and she and her mother are always arguing about her inability to care for her 57 year old daughter independently because of her continued drug usage.  Patient reports picking at her skin when she feels anxious.  During the assessment the scars on her arms and face were visible when she has been picking at her skin.  Patient reports that she started picking at her skin two years ago. Patient reports that she has been diagnosed with Bipolar Depression Type, Anxiety Disorder and ADHD when she was 22 years old.  Patient reports that she has not ben taking her medication for the past 3 months.    Patient reports that she has been in to the following detox and treatment facilities:  March 20, 2008 until February 05, 2009 [facility] [place]   2011      [facility]  2012       [facility]  2012       [facility]  2013          [facility] in [place]   2013       [facility]    Patient denies a past history of psychiatric hospitalizations.  Patient denies psychosis.  Patient denies SI/HI.  Patient reports a past history of criminal involvement due to substance abuse that caused her to go to prison March 2012 and until October 2012 for common law robbery and a probation violation.  Patient reports that she is currently on probation for a heroin charge in [place] and a DUI.  Her probation officer is Raford Pitcher in [place].     Axis I: Bipolar, Depressed Opioid Dependence Axis II: Deferred Axis III:  Past Medical History  Diagnosis Date  . Scoliosis   . Restless legs   . Bipolar 1 disorder, depressed   . Anxiety   . Depression   . ADHD (attention deficit hyperactivity disorder)   . ADHD (attention deficit hyperactivity disorder)    Axis IV: economic problems, educational problems, housing problems, occupational problems, other psychosocial or environmental problems, problems related to legal system/crime, problems related to social environment, problems with access to health care services and problems with primary support group Axis V: 31-40 impairment in reality testing  Past Medical History:  Past Medical History  Diagnosis Date  . Scoliosis   . Restless legs   .  Bipolar 1 disorder, depressed   . Anxiety   . Depression   . ADHD (attention deficit hyperactivity disorder)   . ADHD (attention deficit hyperactivity disorder)     Past Surgical History  Procedure Laterality Date  . Cesarean section    . Laparoscopic abdominal exploration      Family History:  Family History  Problem Relation Age of Onset  . Alcohol abuse Neg Hx   . Arthritis Neg Hx   . Asthma Neg Hx   . Birth defects Neg Hx   . Cancer Neg Hx   . Depression Neg Hx   . COPD Neg Hx   . Drug abuse Neg Hx   . Early death Neg Hx   . Diabetes Neg Hx   . Hearing loss Neg Hx     Social History:  reports that she has been smoking.   She does not have any smokeless tobacco history on file. She reports that she uses illicit drugs (Oxycodone). She reports that she does not drink alcohol.  Additional Social History:     CIWA: CIWA-Ar BP: 122/65 mmHg Pulse Rate: 90 COWS:    Allergies:  Allergies  Allergen Reactions  . Amoxicillin   . Cefaclor     Home Medications:  Medications Prior to Admission  Medication Sig Dispense Refill  . hydrOXYzine (ATARAX/VISTARIL) 25 MG tablet Take 25 mg by mouth every 4 (four) hours as needed for itching.      Marland Kitchen oxyCODONE (ROXICODONE) 15 MG immediate release tablet Take 15 mg by mouth every 4 (four) hours as needed for pain (patient not taking as prescribe, takes anywhere between 60 mg to 200 mg daily).      Marland Kitchen zolpidem (AMBIEN) 10 MG tablet Take 10 mg by mouth at bedtime as needed for sleep.      Marland Kitchen buPROPion (WELLBUTRIN) 100 MG tablet Take 100 mg by mouth 2 (two) times daily.      Marland Kitchen FLUoxetine (PROZAC) 40 MG capsule Take 80 mg by mouth daily.      Marland Kitchen gabapentin (NEURONTIN) 600 MG tablet Take 600 mg by mouth 3 (three) times daily.      Marland Kitchen lisdexamfetamine (VYVANSE) 60 MG capsule Take 60 mg by mouth every morning.        OB/GYN Status:  No LMP recorded. Patient is pregnant.  General Assessment Data Location of Assessment: [facility] ACT Assessment: Yes Living Arrangements: Parent Can pt return to current living arrangement?: Yes Admission Status: Voluntary Is patient capable of signing voluntary admission?: Yes Transfer from: Acute Hospital Referral Source: Self/Family/Friend     Risk to self Suicidal Ideation: No Suicidal Intent: No Is patient at risk for suicide?: No Suicidal Plan?: No Access to Means: No What has been your use of drugs/alcohol within the last 12 months?: addicted ot oxycodone Previous Attempts/Gestures: No How many times?: 0 Other Self Harm Risks: None Reported Triggers for Past Attempts: None known Intentional Self Injurious Behavior: Bruising Comment -  Self Injurious Behavior: Picking at her skin Family Suicide History: No Recent stressful life event(s): Conflict (Comment);Job Loss;Financial Problems;Legal Issues;Recent negative physical changes;Other (Comment) Persecutory voices/beliefs?: No Depression: Yes Depression Symptoms: Despondent;Insomnia;Tearfulness;Isolating;Fatigue;Guilt;Loss of interest in usual pleasures;Feeling worthless/self pity;Feeling angry/irritable Substance abuse history and/or treatment for substance abuse?: Yes Suicide prevention information given to non-admitted patients: Not applicable  Risk to Others Homicidal Ideation: No Thoughts of Harm to Others: No Current Homicidal Intent: No Current Homicidal Plan: No Access to Homicidal Means: No Identified Victim: N/A History of harm to  others?: No Assessment of Violence: None Noted Violent Behavior Description: None  Does patient have access to weapons?: No Criminal Charges Pending?: No Does patient have a court date: No  Psychosis Hallucinations: None noted Delusions: None noted  Mental Status Report Appear/Hygiene: Disheveled Eye Contact: Fair Motor Activity: Freedom of movement Speech: Aggressive;Pressured Level of Consciousness: Alert Mood: Depressed;Suspicious;Angry;Irritable;Worthless, low self-esteem Affect: Depressed Anxiety Level: None Thought Processes: Coherent;Relevant Judgement: Unimpaired Orientation: Person;Place;Time;Situation Obsessive Compulsive Thoughts/Behaviors: None  Cognitive Functioning Concentration: Decreased Memory: Recent Intact;Remote Intact IQ: Average Insight: Poor Impulse Control: Poor Appetite: Fair Weight Loss: 0 Weight Gain: 5 Sleep: Decreased Total Hours of Sleep: 4 Vegetative Symptoms: None  ADLScreening [facility] Assessment Services) Patient's cognitive ability adequate to safely complete daily activities?: Yes Patient able to express need for assistance with ADLs?: Yes Independently performs ADLs?: Yes  (appropriate for developmental age)  Abuse/Neglect [facility]) Physical Abuse: Denies Verbal Abuse: Denies Sexual Abuse: Denies  Prior Inpatient Therapy Prior Inpatient Therapy: Yes Prior Therapy Dates: 2013, 2012, 2011,  Prior Therapy Facilty/Provider(s): [facility], [facility], [facility], [facility] [place], [facility], [facility] [place]  Reason for Treatment: Detox and Treatment   Prior Outpatient Therapy Prior Outpatient Therapy: Yes Prior Therapy Dates: 2014 Prior Therapy Facilty/Provider(s): Josaphine Smith substance abuse therapist Reason for Treatment: substance abuse relapse prevention   ADL Screening (condition at time of admission) Patient's cognitive ability adequate to safely complete daily activities?: Yes Patient able to express need for assistance with ADLs?: Yes Independently performs ADLs?: Yes (appropriate for developmental age) Weakness of Legs: None Weakness of Arms/Hands: None  Home Assistive Devices/Equipment Home Assistive Devices/Equipment: Eyeglasses  Therapy Consults (therapy consults require a physician order) PT Evaluation Needed: No OT Evalulation Needed: No SLP Evaluation Needed: No Abuse/Neglect Assessment (Assessment to be complete while patient is alone) Physical Abuse: Denies Verbal Abuse: Denies Sexual Abuse: Denies Exploitation of patient/patient's resources: Denies Self-Neglect: Denies Possible abuse reported to:: [place] department of social services Values / Beliefs Cultural Requests During Hospitalization: None Spiritual Requests During Hospitalization: None Consults Spiritual Care Consult Needed: No Social Work Consult Needed: No   Nutrition Screen- [facility] Adult/[facility]/AP Patient's home diet: Regular  Additional Information 1:1 In Past 12 Months?: No CIRT Risk: No Elopement Risk: No Does patient have medical clearance?: Yes     Disposition: Patient referred to [facility]  Disposition Initial Assessment Completed for this  Encounter: Yes Disposition of Patient: Referred to Patient referred to: Other (Comment)  On Site Evaluation by:   Reviewed with Physician:     Phillip Heal LaVerne 04/14/2012 9:54 PM

## 2012-04-14 NOTE — MAU Note (Signed)
Paged SW Pollyann Savoy.

## 2012-04-14 NOTE — MAU Note (Signed)
Meal ordered

## 2012-04-14 NOTE — MAU Note (Signed)
Patient states she has been on Oxycodone for about 5 years on and off, had stopped for a while until end of January. Patient has taken today. Patient wants help with getting off medication. Patient also has bumps under her arms and on her abdomen that she has been squeezing.

## 2012-04-14 NOTE — MAU Note (Signed)
ACT team into evaluate patient.

## 2012-04-14 NOTE — MAU Note (Signed)
Paged SW Pollyann Savoy [phone]

## 2012-04-14 NOTE — MAU Note (Signed)
Called lab to follow up on UDS, has not been ran, explained need this STAT as [facility] will not take patient until resulted.

## 2012-04-14 NOTE — MAU Note (Signed)
Per Dr. Ellyn Hack she contacted psychiatrist of [facility] and he is sending the ACT team over to evaluate patient.

## 2012-04-15 ENCOUNTER — Encounter (HOSPITAL_COMMUNITY): Payer: Self-pay

## 2012-04-15 ENCOUNTER — Inpatient Hospital Stay (HOSPITAL_COMMUNITY)
Admission: AD | Admit: 2012-04-15 | Discharge: 2012-04-24 | DRG: 745 | Disposition: A | Discharge status: Home / Self Care | Payer: BC Managed Care – PPO | Attending: Psychiatry | Admitting: Psychiatry

## 2012-04-15 DIAGNOSIS — F3130 Bipolar disorder, current episode depressed, mild or moderate severity, unspecified: Secondary | ICD-10-CM | POA: Diagnosis present

## 2012-04-15 DIAGNOSIS — F1120 Opioid dependence, uncomplicated: Secondary | ICD-10-CM | POA: Diagnosis present

## 2012-04-15 DIAGNOSIS — F1410 Cocaine abuse, uncomplicated: Secondary | ICD-10-CM | POA: Diagnosis present

## 2012-04-15 DIAGNOSIS — F909 Attention-deficit hyperactivity disorder, unspecified type: Secondary | ICD-10-CM

## 2012-04-15 DIAGNOSIS — Z331 Pregnant state, incidental: Secondary | ICD-10-CM

## 2012-04-15 DIAGNOSIS — E0590 Thyrotoxicosis, unspecified without thyrotoxic crisis or storm: Secondary | ICD-10-CM

## 2012-04-15 DIAGNOSIS — F1994 Other psychoactive substance use, unspecified with psychoactive substance-induced mood disorder: Secondary | ICD-10-CM | POA: Diagnosis present

## 2012-04-15 DIAGNOSIS — Z79899 Other long term (current) drug therapy: Secondary | ICD-10-CM

## 2012-04-15 DIAGNOSIS — O99322 Drug use complicating pregnancy, second trimester: Secondary | ICD-10-CM

## 2012-04-15 DIAGNOSIS — F19939 Other psychoactive substance use, unspecified with withdrawal, unspecified: Principal | ICD-10-CM | POA: Diagnosis present

## 2012-04-15 DIAGNOSIS — F1193 Opioid use, unspecified with withdrawal: Secondary | ICD-10-CM | POA: Diagnosis present

## 2012-04-15 DIAGNOSIS — M4120 Other idiopathic scoliosis, site unspecified: Secondary | ICD-10-CM | POA: Diagnosis present

## 2012-04-15 DIAGNOSIS — F1910 Other psychoactive substance abuse, uncomplicated: Secondary | ICD-10-CM

## 2012-04-15 MED ORDER — BUPRENORPHINE HCL 8 MG SL SUBL
4.0000 mg | SUBLINGUAL_TABLET | Freq: Once | SUBLINGUAL | Status: AC
  Administered 2012-04-15: 4 mg via SUBLINGUAL
  Filled 2012-04-15: qty 2

## 2012-04-15 MED ORDER — BUPRENORPHINE HCL 8 MG SL SUBL
4.0000 mg | SUBLINGUAL_TABLET | Freq: Two times a day (BID) | SUBLINGUAL | Status: DC
  Administered 2012-04-15 – 2012-04-18 (×6): 4 mg via SUBLINGUAL
  Filled 2012-04-15 (×2): qty 2
  Filled 2012-04-15 (×2): qty 1
  Filled 2012-04-15: qty 2

## 2012-04-15 MED ORDER — ACETAMINOPHEN 325 MG PO TABS
650.0000 mg | ORAL_TABLET | Freq: Four times a day (QID) | ORAL | Status: DC | PRN
  Administered 2012-04-16: 650 mg via ORAL

## 2012-04-15 MED ORDER — PRENATAL MULTIVITAMIN CH
1.0000 | ORAL_TABLET | Freq: Every day | ORAL | Status: DC
  Administered 2012-04-16 – 2012-04-24 (×9): 1 via ORAL
  Filled 2012-04-15 (×11): qty 1

## 2012-04-15 MED ORDER — ALUM & MAG HYDROXIDE-SIMETH 200-200-20 MG/5ML PO SUSP
30.0000 mL | ORAL | Status: DC | PRN
  Administered 2012-04-20: 30 mL via ORAL

## 2012-04-15 MED ORDER — MAGNESIUM HYDROXIDE 400 MG/5ML PO SUSP
30.0000 mL | Freq: Every day | ORAL | Status: DC | PRN
  Administered 2012-04-19: 30 mL via ORAL

## 2012-04-15 NOTE — MAU Note (Signed)
Called [facility] spoke with Ala Dach to let him know lab results are back, will review and then call us with disposition.

## 2012-04-15 NOTE — Progress Notes (Addendum)
Admission note: 22 yo wf admitted for opiate use X6 weeks. She is [redacted] weeks pregnant and wants detox from opiates so she can care for her baby. Last in rehab in 12/13, states she gets bored easily staying at home and she has a problem w/her back and scoliosis and this is why she needs pain pills to help w/her pain. She is experiencing /WD s/s of diarrhea, nausea and vomiting and sweats.UDS positive for opiates and cocaine.  She is very worried about herself, her baby and what is going to happen once the baby is here. She states her parents are supportive. She never mentions the father. She also has concerns of housing once the baby is here and social support. States she has diagnosis of BiPolar and ADHD. Oriented to the unit, her room, given breakfast and went to bed for the part of the morning b/c she was so tired and needed rest. 1800 feeling okay after taking Subutex this afternoon, sweating but actually feeling better.

## 2012-04-15 NOTE — MAU Note (Signed)
Report called to Architect at [facility].

## 2012-04-15 NOTE — Progress Notes (Signed)
Adult Psychoeducational Group Note  Date:  04/15/2012 Time: 0900  Group Topic/Focus:  Self inventory sheet  Participation Level:  Did Not Attend     Roselee Culver 04/15/2012, 12:25 PM

## 2012-04-15 NOTE — BHH Counselor (Signed)
Phillip Heal, ACT counselor at [facility], submitted Pt for admission to [facility]. Laverle Hobby, [facility] confirmed bed availability. Dr. Geoffery Lyons reviewed clinical information and accepted Pt to room 303-1.   Harlin Rain Patsy [place], LPC, [facility] Assessment Counselor

## 2012-04-15 NOTE — BHH Group Notes (Signed)
BHH LCSW Group Therapy  B[facility] Group Notes: (Clinical Social Work)   04/15/2012      Type of Therapy:  Group Therapy   Participation Level:  Did Not Attend    Ambrose Mantle, LCSW 04/15/2012, 11:22 AM

## 2012-04-15 NOTE — BHH Suicide Risk Assessment (Signed)
Suicide Risk Assessment  Admission Assessment     Nursing information obtained from:  Patient Demographic factors:  Adolescent or young adult;Caucasian;Unemployed Current Mental Status:  NA Loss Factors:  Financial problems / change in socioeconomic status Historical Factors:    Risk Reduction Factors:  Pregnancy  CLINICAL FACTORS:   Alcohol/Substance Abuse/Dependencies  COGNITIVE FEATURES THAT CONTRIBUTE TO RISK: None identified   SUICIDE RISK:   Moderate:  Frequent suicidal ideation with limited intensity, and duration, some specificity in terms of plans, no associated intent, good self-control, limited dysphoria/symptomatology, some risk factors present, and identifiable protective factors, including available and accessible social support.  PLAN OF CARE: Supportive approach/coping skills/relapse prevention                              Opioid Detox: Discussed alternatives. She did well on the Suboxone in the                                    past for what she would like to detox with subutex  I certify that inpatient services furnished can reasonably be expected to improve the patient's condition.  Madge Therrien A 04/15/2012, 3:04 PM

## 2012-04-15 NOTE — Progress Notes (Signed)
Patient ID: Alyssa Hansen, female   DOB: [birth year], 22 y.o.   MRN: [id number] D)  Has been out on the hall this evening, was in dayroom playing cards, has been interacting appropriately with select peers and staff.  Attended group, came to med window afterward, was quiet, somewhat guarded, but compliant with hs meds.   A)  Will continue to monitor for safety, continue POC. R)  Safety maintained.

## 2012-04-15 NOTE — H&P (Signed)
Psychiatric Admission Assessment Adult  Patient Identification:  Alyssa Hansen Date of Evaluation:  04/15/2012 Chief Complaint:  Opioid Dependence 304.00 Bipolar Disorder most recent episode depressed, severe, without psychotic features 296.53 History of Present Illness:: [redacted] weeks pregnant. Was throwing up a lot. She is using OxyContin. She uses between 60 and 200 mg per day. Gaylyn Rong been in and out of rehab last five years. Last rehab got out in December. Abstinent for 4 months. Just at home, gets bored, goes back to using. Has a prescription 75 Oxy's 15. Has scoliosis of her back. She also buys from the street. Experiences withdrawal, sweats, diarrhea, nausea, vomiting. Has been using everyday for last 6 weeks Elements:  Location:  in patient. Quality:  unable to function. Severity:  severe. Timing:  every day. Duration:  18 weeks pregnancy. Context:  Opioid Dependent [redacted] weeks pregnant needing safe detox. Associated Signs/Synptoms: Depression Symptoms:  depressed mood, insomnia, fatigue, difficulty concentrating, loss of energy/fatigue, (Hypo) Manic Symptoms:  Denies Anxiety Symptoms:  Excessive Worry, Psychotic Symptoms:  Denies PTSD Symptoms: Negative  Psychiatric Specialty Exam: Physical Exam  Review of Systems  Constitutional: Positive for malaise/fatigue and diaphoresis.  HENT: Negative.   Eyes: Positive for pain.  Respiratory: Negative.   Cardiovascular: Negative.   Gastrointestinal: Positive for nausea, vomiting and diarrhea.  Genitourinary: Negative.   Musculoskeletal: Positive for back pain.  Skin: Negative.   Neurological: Positive for weakness.  Endo/Heme/Allergies: Negative.   Psychiatric/Behavioral: Positive for substance abuse. The patient is nervous/anxious and has insomnia.     Blood pressure 124/77, pulse 87, temperature 97.7 F (36.5 C), temperature source Oral, resp. rate 20, height 5\' 5"  (1.651 m), weight 129.729 kg (286 lb).Body mass index is 47.59  kg/(m^2).  General Appearance: Fairly Groomed  Patent attorney::  Fair  Speech:  Clear and Coherent, Slow and not spontaneous  Volume:  Decreased  Mood:  worried  Affect:  Restricted  Thought Process:  Coherent and Goal Directed  Orientation:  Full (Time, Place, and Person)  Thought Content:  worries, concerns  Suicidal Thoughts:  No  Homicidal Thoughts:  No  Memory:  Immediate;   Fair Recent;   Fair Remote;   Fair  Judgement:  Fair  Insight:  superficial  Psychomotor Activity:  Normal  Concentration:  Fair  Recall:  Fair  Akathisia:  No  Handed:  Right  AIMS (if indicated):     Assets:  Desire for Improvement Housing Social Support  Sleep:       Past Psychiatric History: Diagnosis:  Hospitalizations:  Outpatient Care:  Substance Abuse Care: Last rehab [facility] 4 months in December 2013. Was abstinent about a month.  Has been to [facility] in [place] (11 months) [facility], [facility] treatment [facility] twice, [facility] in [place] (month and a half) [facility] in [place] 3 months, Prison   Self-Mutilation:  Suicidal Attempts:  Violent Behaviors:   Past Medical History:   Past Medical History  Diagnosis Date  . Scoliosis   . Restless legs   . Bipolar 1 disorder, depressed   . Anxiety   . Depression   . ADHD (attention deficit hyperactivity disorder)   . ADHD (attention deficit hyperactivity disorder)     Allergies:   Allergies  Allergen Reactions  . Amoxicillin   . Cefaclor    PTA Medications: Prescriptions prior to admission  Medication Sig Dispense Refill  . buPROPion (WELLBUTRIN) 100 MG tablet Take 100 mg by mouth 2 (two) times daily.      Marland Kitchen FLUoxetine (PROZAC)  40 MG capsule Take 80 mg by mouth daily.      Marland Kitchen gabapentin (NEURONTIN) 600 MG tablet Take 600 mg by mouth 3 (three) times daily.      . hydrOXYzine (ATARAX/VISTARIL) 25 MG tablet Take 25 mg by mouth every 4 (four) hours as needed for itching.      . lisdexamfetamine (VYVANSE) 60 MG capsule Take 60 mg by mouth  every morning.      Marland Kitchen oxyCODONE (ROXICODONE) 15 MG immediate release tablet Take 15 mg by mouth every 4 (four) hours as needed for pain (patient not taking as prescribe, takes anywhere between 60 mg to 200 mg daily).      Marland Kitchen zolpidem (AMBIEN) 10 MG tablet Take 10 mg by mouth at bedtime as needed for sleep.        Previous Psychotropic Medications:  Medication/Dose  Neuronton, Vyvnase, Prozac, Vistaril, Ambien, (quit when she found out she was pregnant)               Substance Abuse History in the last 12 months:  yes  Consequences of Substance Abuse: Legal Consequences:  DWI, other drug related charges Blackouts:   Withdrawal Symptoms:   Cramps Diaphoresis Diarrhea Headaches Nausea Vomiting  Social History:  reports that she has been smoking.  She does not have any smokeless tobacco history on file. She reports that she uses illicit drugs (Oxycodone). She reports that she does not drink alcohol. Additional Social History:                      Current Place of Residence:  Lives with mother and daughter Place of Birth:   Family Members: Marital Status:  Single Children:  Sons:  Daughters: 4 Relationships: Education:  HS Print production planner Problems/Performance: None Religious Beliefs/Practices: History of Abuse (Emotional/Phsycial/Sexual) Denies Teacher, music History:  None. Legal History: Has done prison (drug related) She is on probation for heroin Hobbies/Interests:  Family History:   Family History  Problem Relation Age of Onset  . Alcohol abuse Neg Hx   . Arthritis Neg Hx   . Asthma Neg Hx   . Birth defects Neg Hx   . Cancer Neg Hx   . Depression Neg Hx   . COPD Neg Hx   . Drug abuse Neg Hx   . Early death Neg Hx   . Diabetes Neg Hx   . Hearing loss Neg Hx     Results for orders placed during the hospital encounter of 04/14/12 (from the past 72 hour(s))  URINE RAPID DRUG SCREEN (HOSP PERFORMED)     Status: Abnormal    Collection Time    04/14/12  5:15 PM      Result Value Range   Opiates POSITIVE (*) NONE DETECTED   Cocaine POSITIVE (*) NONE DETECTED   Benzodiazepines NONE DETECTED  NONE DETECTED   Amphetamines NONE DETECTED  NONE DETECTED   Tetrahydrocannabinol NONE DETECTED  NONE DETECTED   Barbiturates NONE DETECTED  NONE DETECTED   Comment:            DRUG SCREEN FOR MEDICAL PURPOSES     ONLY.  IF CONFIRMATION IS NEEDED     FOR ANY PURPOSE, NOTIFY LAB     WITHIN 5 DAYS.                LOWEST DETECTABLE LIMITS     FOR URINE DRUG SCREEN     Drug Class       Cutoff (ng/mL)  Amphetamine      1000     Barbiturate      200     Benzodiazepine   200     Tricyclics       300     Opiates          300     Cocaine          300     THC              50  URINALYSIS, ROUTINE W REFLEX MICROSCOPIC     Status: Abnormal   Collection Time    04/14/12  5:59 PM      Result Value Range   Color, Urine YELLOW  YELLOW   APPearance HAZY (*) CLEAR   Specific Gravity, Urine >1.030 (*) 1.005 - 1.030   pH 6.0  5.0 - 8.0   Glucose, UA NEGATIVE  NEGATIVE mg/dL   Hgb urine dipstick NEGATIVE  NEGATIVE   Bilirubin Urine NEGATIVE  NEGATIVE   Ketones, ur NEGATIVE  NEGATIVE mg/dL   Protein, ur NEGATIVE  NEGATIVE mg/dL   Urobilinogen, UA 0.2  0.0 - 1.0 mg/dL   Nitrite NEGATIVE  NEGATIVE   Leukocytes, UA NEGATIVE  NEGATIVE   Comment: MICROSCOPIC NOT DONE ON URINES WITH NEGATIVE PROTEIN, BLOOD, LEUKOCYTES, NITRITE, OR GLUCOSE <1000 mg/dL.  CBC     Status: Abnormal   Collection Time    04/14/12 10:26 PM      Result Value Range   WBC 10.9 (*) 4.0 - 10.5 K/uL   RBC 4.21  3.87 - 5.11 MIL/uL   Hemoglobin 11.5 (*) 12.0 - 15.0 g/dL   HCT 30.8 (*) 65.7 - 84.6 %   MCV 82.2  78.0 - 100.0 fL   MCH 27.3  26.0 - 34.0 pg   MCHC 33.2  30.0 - 36.0 g/dL   RDW 96.2  95.2 - 84.1 %   Platelets 193  150 - 400 K/uL  COMPREHENSIVE METABOLIC PANEL     Status: Abnormal   Collection Time    04/14/12 10:26 PM      Result Value Range    Sodium 138  135 - 145 mEq/L   Potassium 3.4 (*) 3.5 - 5.1 mEq/L   Chloride 101  96 - 112 mEq/L   CO2 28  19 - 32 mEq/L   Glucose, Bld 104 (*) 70 - 99 mg/dL   BUN 5 (*) 6 - 23 mg/dL   Creatinine, Ser 3.24  0.50 - 1.10 mg/dL   Calcium 9.4  8.4 - 40.1 mg/dL   Total Protein 6.3  6.0 - 8.3 g/dL   Albumin 2.5 (*) 3.5 - 5.2 g/dL   AST 24  0 - 37 U/L   ALT 16  0 - 35 U/L   Alkaline Phosphatase 63  39 - 117 U/L   Total Bilirubin 0.2 (*) 0.3 - 1.2 mg/dL   GFR calc non Af Amer >90  >90 mL/min   GFR calc Af Amer >90  >90 mL/min   Comment:            The eGFR has been calculated     using the CKD EPI equation.     This calculation has not been     validated in all clinical     situations.     eGFR's persistently     <90 mL/min signify     possible Chronic Kidney Disease.   Psychological Evaluations:  Assessment:   AXIS I:  Opioid  Dependence/withdrawal, ADHD AXIS II:  Deferred AXIS III:   Past Medical History  Diagnosis Date  . Scoliosis   . Restless legs   . Bipolar 1 disorder, depressed   . Anxiety   . Depression   . ADHD (attention deficit hyperactivity disorder)   . ADHD (attention deficit hyperactivity disorder)    AXIS IV:  other psychosocial or environmental problems AXIS V:  41-50 serious symptoms  Treatment Plan/Recommendations:  Supportive approach/coping skills/relapse prevention                                                                 Detox safely  Treatment Plan Summary: Daily contact with patient to assess and evaluate symptoms and progress in treatment Medication management Discussed options for detox. She did well when she was detox with Suboxone in the past. We discussed methadone, symptomatic relief of symptoms, Subutex. Seems that Subutex is the best modality to detox right now Current Medications:  Current Facility-Administered Medications  Medication Dose Route Frequency Provider Last Rate Last Dose  . acetaminophen (TYLENOL) tablet 650 mg  650  mg Oral Q6H PRN Rachael Fee, MD      . alum & mag hydroxide-simeth (MAALOX/MYLANTA) 200-200-20 MG/5ML suspension 30 mL  30 mL Oral Q4H PRN Rachael Fee, MD      . magnesium hydroxide (MILK OF MAGNESIA) suspension 30 mL  30 mL Oral Daily PRN Rachael Fee, MD        Observation Level/Precautions:  15 minute checks  Laboratory:  As per the ED  Psychotherapy:  Individual/group  Medications:  Detox Subutex 4 mg   Consultations:    Discharge Concerns:  5-7 days  Estimated LOS:   Other:     I certify that inpatient services furnished can reasonably be expected to improve the patient's condition.   Shye Doty A 4/12/201410:57 AM

## 2012-04-15 NOTE — MAU Note (Signed)
Ava with ACT team in to see pt and sign papers for pt to go to [facility]

## 2012-04-15 NOTE — Progress Notes (Signed)
All papers signed for admission to [facility] thru Ava with ACT team.

## 2012-04-15 NOTE — MAU Note (Signed)
Called AC at [facility] to inquire about status of acceptance of patient for admission to [facility] was told Ava with ACT team should be calling us however patient is accepted but will unable to take transfer until 3:30 or 4:00 a.m, notified [place] CNM

## 2012-04-15 NOTE — Progress Notes (Signed)
Janalyn Shy RN [facility] at [facility] called [facility] at [facility] and arranged transport for pt to [facility] by [employer]

## 2012-04-15 NOTE — Progress Notes (Signed)
Pt d/c ambulatory with Gwen NT and [facility] security for transport to [facility].

## 2012-04-15 NOTE — Progress Notes (Signed)
Psychoeducational Group Note  Date:  04/15/2012 Time:  0945 am  Group Topic/Focus:  Identifying Needs:   The focus of this group is to help patients identify their personal needs that have been historically problematic and identify healthy behaviors to address their needs.  Participation Level:  Did Not Attend    Andrena Mews 04/15/2012,2:10 PM

## 2012-04-16 MED ORDER — BUPRENORPHINE HCL 8 MG SL SUBL: 4.0000 mg | SUBLINGUAL_TABLET | Freq: Once | SUBLINGUAL | Status: DC

## 2012-04-16 MED ORDER — PHENYLEPH-SHARK LIV OIL-MO-PET 0.25-3-14-71.9 % RE OINT
TOPICAL_OINTMENT | Freq: Two times a day (BID) | RECTAL | Status: DC | PRN
  Administered 2012-04-20: 22:00:00 via RECTAL
  Filled 2012-04-16: qty 28.4

## 2012-04-16 MED ORDER — BUPRENORPHINE HCL 8 MG SL SUBL
4.0000 mg | SUBLINGUAL_TABLET | SUBLINGUAL | Status: AC
  Administered 2012-04-16: 4 mg via SUBLINGUAL

## 2012-04-16 NOTE — MAU Provider Note (Signed)
Attestation of Attending Supervision of Advanced Practitioner: Evaluation and management procedures were performed by the PA/NP/CNM/OB Fellow under my supervision/collaboration. Chart reviewed and agree with management and plan.  Mitsue Peery V 04/16/2012 3:02 PM

## 2012-04-16 NOTE — Progress Notes (Signed)
Pt complained of lower abdominal cramping and pain  Pt denies spotting or vaginal discharge of any kind   She reported that she felt it might be related to her pregnancy but wasn't sure   Dr Dub Mikes requested that we notify [facility] and inquire of what should be done at this point   Spoke with the [employer] attending on call and  Dr Ellyn Hack notified the emergency OB  RN about the matter and the RN is coming to [facility] to attempt a dopplar examination  Dr Ellyn Hack also recommended the pt be sent to [facility] tomorrow for an ultrasound  since she would be missing her regular appointment with her [facility] doctor   Dr Dub Mikes was made aware of the recommendations and agreed with same   Information will be passed on for arrangements to be made tomorrow for pt to go to [facility]

## 2012-04-16 NOTE — Progress Notes (Signed)
BHH Group Notes:  (Nursing/MHT/Case Management/Adjunct)  Date:  04/16/2012  Time:  10:21 AM  Type of Therapy:  Therapeutic Activity  Participation Level:  Did Not Attend   Dalia Heading 04/16/2012, 10:21 AM

## 2012-04-16 NOTE — Progress Notes (Signed)
D slept fair last nite, appetite is good, energy level is to be expected since she is pregnant, ability to pay attention is normal, depressed 4/10 and hopeless 2/10, WD s/s include chilss, cravings and agitation along w/headache, denies Si or HI and no AVH, states she thinks she is "okay", taking meds as ordered by MD, attending group and interacting w/peers on the unit, states she is having no problems w/her pregnancy at this time A q90min safety checks continue and support offered, POC continues, encouraged to continue attending group and trying to participate R patient remains safe on the unit

## 2012-04-16 NOTE — Progress Notes (Signed)
Rapid response RN  Corrie Dandy performed a dopplar on pt in the exam room and was able to quickly find fetus heartbeat at a rate of 140 BPM     She also discussed her pain with the patient and believes it to be round ligament pain  Pt seemed relieved   Pt will be transported to [facility] tomorrow for an ultra sound as described in previous note   This Clinical research associate has been informed by Dr Dub Mikes that he would see that the pa made arrangements for same tomorrow   Pt has been informed and is agreeable

## 2012-04-16 NOTE — Progress Notes (Signed)
B[facility] Group Notes:  (Nursing/MHT/Case Management/Adjunct)  Date:  04/16/2012  Time:  2:35 AM  Type of Therapy:  wrap up group  Participation Level:  Active  Participation Quality:  Appropriate, Attentive and Resistant  Affect:  Depressed  Cognitive:  Alert  Insight:  Appropriate  Engagement in Group:  Limited  Modes of Intervention:  Clarification, Education and Support  Summary of Progress/Problems: Pt was unable to share or produce answers to prompted questions.  Pt did report that is was good to speak to her four year old daughter and her goal was to stay clean . When asked to share something she was grateful for in her life, she could not. Pt became flustered and a little emotional when asked additional questions.    Shelah Lewandowsky 04/16/2012, 2:35 AM

## 2012-04-16 NOTE — BHH Group Notes (Signed)
[facility] LCSW Group Therapy  04/16/2012  10:00-11:00am  Summary of Progress/Problems:  The main focus of today's process group was to consider whether and how to increase/improve support system.  Four definitions of support were discussed and an exercise was utilized to show how much stronger we become with additional supports providing accountability, improved physical and emotional health, and better problem-solving skills.  An emphasis was placed on using counselor, doctor, therapy groups, 12-step groups, and problem-specific support groups to expand supports. The patient expressed frustration when called on several different times, saying she did not know what the question was.  She also stated she was not sure about getting additional supports because she believes she should be able to "do this on my own."  She is not sure whether her addiction is due to her economic status or to genetics or to a combination, but she talked about people in "the hood" being exposed all the time and people in rich neighborhoods "keeping it hidden."  Her motivation to look for additional supports is 5 out of 10, because she does not know if she wants to do this or not.  Type of Therapy:  Group Therapy  Participation Level:  Active  Participation Quality:  Inattentive, Resistant and Sharing  Affect:  Blunted and Irritable  Cognitive:  Oriented  Insight:  Developing/Improving  Engagement in Therapy:  Developing/Improving  Modes of Intervention:  Exploration and Motivational Interviewing   Sarina Ser 04/16/2012, 12:36 PM

## 2012-04-16 NOTE — Progress Notes (Signed)
[facility] MD Progress Note  04/16/2012 1:04 PM Alyssa Hansen  MRN:  [id number] Subjective:  Alyssa Hansen did respond well to the Subutex this morning. She is now ( 6 hours later) experiencing some withdrawal symptoms. She vomited after lunch. Will go ahead and give her an extra dose of subutex 4 mg now and reassess. Will continue to treat symptomatically Diagnosis:  Opioid dependence/withdrawal  ADL's:  Intact  Sleep: Fair  Appetite:  Fair  Suicidal Ideation:  Plan:  denies Intent:  denies Means:  denies Homicidal Ideation:  Plan:  denies Intent:  denies Means:  denies AEB (as evidenced by):  Psychiatric Specialty Exam: Review of Systems  Constitutional: Positive for diaphoresis.  HENT: Negative.   Eyes: Negative.   Respiratory: Negative.   Cardiovascular: Negative.   Gastrointestinal: Positive for nausea and vomiting.  Genitourinary: Negative.   Musculoskeletal: Negative.   Skin: Negative.   Neurological: Negative.   Endo/Heme/Allergies: Negative.   Psychiatric/Behavioral: Positive for substance abuse. The patient is nervous/anxious.     Blood pressure 152/89, pulse 89, temperature 97.7 F (36.5 C), temperature source Oral, resp. rate 20, height 5\' 5"  (1.651 m), weight 129.729 kg (286 lb).Body mass index is 47.59 kg/(m^2).  General Appearance: Fairly Groomed  Patent attorney::  Fair  Speech:  Clear and Coherent  Volume:  Normal  Mood:  Anxious and worried  Affect:  worried, anxious  Thought Process:  Coherent and Goal Directed  Orientation:  Full (Time, Place, and Person)  Thought Content:  worries, concerns  Suicidal Thoughts:  No  Homicidal Thoughts:  No  Memory:  Immediate;   Fair Recent;   Fair Remote;   Fair  Judgement:  Fair  Insight:  Present  Psychomotor Activity:  Restlessness  Concentration:  Fair  Recall:  Fair  Akathisia:  No  Handed:  Right  AIMS (if indicated):     Assets:  Desire for Improvement  Sleep:  Number of Hours: 6   Current  Medications: Current Facility-Administered Medications  Medication Dose Route Frequency Provider Last Rate Last Dose  . acetaminophen (TYLENOL) tablet 650 mg  650 mg Oral Q6H PRN Rachael Fee, MD   650 mg at 04/16/12 0934  . alum & mag hydroxide-simeth (MAALOX/MYLANTA) 200-200-20 MG/5ML suspension 30 mL  30 mL Oral Q4H PRN Rachael Fee, MD      . buprenorphine (SUBUTEX) sublingual tablet 4 mg  4 mg Sublingual BID Rachael Fee, MD   4 mg at 04/16/12 0746  . buprenorphine (SUBUTEX) sublingual tablet 4 mg  4 mg Sublingual Once Rachael Fee, MD      . magnesium hydroxide (MILK OF MAGNESIA) suspension 30 mL  30 mL Oral Daily PRN Rachael Fee, MD      . phenylephrine-shark liver oil-mineral oil-petrolatum (PREPARATION H) rectal ointment   Rectal BID PRN Rachael Fee, MD      . prenatal multivitamin tablet 1 tablet  1 tablet Oral Q1200 Rachael Fee, MD   1 tablet at 04/16/12 1251    Lab Results:  Results for orders placed during the hospital encounter of 04/14/12 (from the past 48 hour(s))  URINE RAPID DRUG SCREEN (HOSP PERFORMED)     Status: Abnormal   Collection Time    04/14/12  5:15 PM      Result Value Range   Opiates POSITIVE (*) NONE DETECTED   Cocaine POSITIVE (*) NONE DETECTED   Benzodiazepines NONE DETECTED  NONE DETECTED   Amphetamines NONE DETECTED  NONE DETECTED  Tetrahydrocannabinol NONE DETECTED  NONE DETECTED   Barbiturates NONE DETECTED  NONE DETECTED   Comment:            DRUG SCREEN FOR MEDICAL PURPOSES     ONLY.  IF CONFIRMATION IS NEEDED     FOR ANY PURPOSE, NOTIFY LAB     WITHIN 5 DAYS.                LOWEST DETECTABLE LIMITS     FOR URINE DRUG SCREEN     Drug Class       Cutoff (ng/mL)     Amphetamine      1000     Barbiturate      200     Benzodiazepine   200     Tricyclics       300     Opiates          300     Cocaine          300     THC              50  URINALYSIS, ROUTINE W REFLEX MICROSCOPIC     Status: Abnormal   Collection Time    04/14/12   5:59 PM      Result Value Range   Color, Urine YELLOW  YELLOW   APPearance HAZY (*) CLEAR   Specific Gravity, Urine >1.030 (*) 1.005 - 1.030   pH 6.0  5.0 - 8.0   Glucose, UA NEGATIVE  NEGATIVE mg/dL   Hgb urine dipstick NEGATIVE  NEGATIVE   Bilirubin Urine NEGATIVE  NEGATIVE   Ketones, ur NEGATIVE  NEGATIVE mg/dL   Protein, ur NEGATIVE  NEGATIVE mg/dL   Urobilinogen, UA 0.2  0.0 - 1.0 mg/dL   Nitrite NEGATIVE  NEGATIVE   Leukocytes, UA NEGATIVE  NEGATIVE   Comment: MICROSCOPIC NOT DONE ON URINES WITH NEGATIVE PROTEIN, BLOOD, LEUKOCYTES, NITRITE, OR GLUCOSE <1000 mg/dL.  CBC     Status: Abnormal   Collection Time    04/14/12 10:26 PM      Result Value Range   WBC 10.9 (*) 4.0 - 10.5 K/uL   RBC 4.21  3.87 - 5.11 MIL/uL   Hemoglobin 11.5 (*) 12.0 - 15.0 g/dL   HCT 16.1 (*) 09.6 - 04.5 %   MCV 82.2  78.0 - 100.0 fL   MCH 27.3  26.0 - 34.0 pg   MCHC 33.2  30.0 - 36.0 g/dL   RDW 40.9  81.1 - 91.4 %   Platelets 193  150 - 400 K/uL  COMPREHENSIVE METABOLIC PANEL     Status: Abnormal   Collection Time    04/14/12 10:26 PM      Result Value Range   Sodium 138  135 - 145 mEq/L   Potassium 3.4 (*) 3.5 - 5.1 mEq/L   Chloride 101  96 - 112 mEq/L   CO2 28  19 - 32 mEq/L   Glucose, Bld 104 (*) 70 - 99 mg/dL   BUN 5 (*) 6 - 23 mg/dL   Creatinine, Ser 7.82  0.50 - 1.10 mg/dL   Calcium 9.4  8.4 - 95.6 mg/dL   Total Protein 6.3  6.0 - 8.3 g/dL   Albumin 2.5 (*) 3.5 - 5.2 g/dL   AST 24  0 - 37 U/L   ALT 16  0 - 35 U/L   Alkaline Phosphatase 63  39 - 117 U/L   Total Bilirubin 0.2 (*) 0.3 - 1.2 mg/dL  GFR calc non Af Amer >90  >90 mL/min   GFR calc Af Amer >90  >90 mL/min   Comment:            The eGFR has been calculated     using the CKD EPI equation.     This calculation has not been     validated in all clinical     situations.     eGFR's persistently     <90 mL/min signify     possible Chronic Kidney Disease.    Physical Findings: AIMS: Facial and Oral Movements Muscles  of Facial Expression: None, normal Lips and Perioral Area: None, normal Jaw: None, normal Tongue: None, normal,Extremity Movements Upper (arms, wrists, hands, fingers): None, normal Lower (legs, knees, ankles, toes): None, normal, Trunk Movements Neck, shoulders, hips: None, normal, Overall Severity Incapacitation due to abnormal movements: None, normal Patient's awareness of abnormal movements (rate only patient's report): No Awareness, Dental Status Current problems with teeth and/or dentures?: No Does patient usually wear dentures?: No  CIWA:  CIWA-Ar Total: 1 COWS:  COWS Total Score: 3  Treatment Plan Summary: Daily contact with patient to assess and evaluate symptoms and progress in treatment Medication management  Plan: Supportive approach/coping skills/relapse prevention           Continue to detox with Subutex           Consider supportive measures like Zofran  Medical Decision Making Problem Points:  Review of psycho-social stressors (1) Data Points:  Review of medication regiment & side effects (2)  I certify that inpatient services furnished can reasonably be expected to improve the patient's condition.   Courage Biglow A 04/16/2012, 1:04 PM

## 2012-04-16 NOTE — Progress Notes (Signed)
Adult Psychoeducational Group Note  Date:  04/16/2012 Time:  0900  Group Topic/Focus:  Self inventory sheet  Participation Level:  Active  Participation Quality:  Appropriate  Affect:  Appropriate  Cognitive:  Appropriate  Insight: Appropriate  Engagement in Group:  Improving  Modes of Intervention:  Support  Additional Comments:  Very attentive but did not participate very much  Roselee Culver 04/16/2012, 10:37 AM

## 2012-04-16 NOTE — Progress Notes (Signed)
Psychoeducational Group Note  Date:  04/16/2012 Time:  0945 am  Group Topic/Focus:  Making Healthy Choices:   The focus of this group is to help patients identify negative/unhealthy choices they were using prior to admission and identify positive/healthier coping strategies to replace them upon discharge.  Participation Level:  Did Not Attend   Andrena Mews 04/16/2012, 10:29 AM

## 2012-04-16 NOTE — Progress Notes (Signed)
Patient ID: Alyssa Hansen, female   DOB: [birth year], 22 y.o.   MRN: [id number]  A[facility]a [facility] came to see/ assess Pt and found the pt and/ or the fetus to be in no acute distress and FHR- 140. `

## 2012-04-17 DIAGNOSIS — F1120 Opioid dependence, uncomplicated: Secondary | ICD-10-CM

## 2012-04-17 DIAGNOSIS — F19939 Other psychoactive substance use, unspecified with withdrawal, unspecified: Principal | ICD-10-CM

## 2012-04-17 NOTE — [facility] Group Notes (Signed)
[facility] LCSW Group Therapy  04/17/2012 1:15 PM Type of Therapy:  Group Therapy  Participation Level:  Did Not Attend  Alyssa Hansen

## 2012-04-17 NOTE — Progress Notes (Signed)
Adult Psychoeducational Group Note  Date:  04/17/2012 Time:  10:58 AM  Group Topic/Focus:  Relaxation techniques, the group consisted of doing a breathing exercise and talking about the experience.  Participation Level:  Active  Participation Quality:  Appropriate and Attentive  Affect:  Appropriate  Cognitive:  Alert and Appropriate  Insight: Appropriate and Good  Engagement in Group:  Engaged  Modes of Intervention:  Activity and Discussion  Additional Comments:  Pt s goal is to stay clean   Audryana Hockenberry T 04/17/2012, 10:58 AM

## 2012-04-17 NOTE — Progress Notes (Signed)
Pt has been up for some groups today but has mainly been in her bed.  She has not c/o any abdominal cramping today.  She did get upset after lunch she thought she had an order to get the subutex three times a day.  She stated,"I know that Dr. Dub Mikes put me on the subutex three time a day because he told me so go read his notes"  This nurse did just that and explained to pt that he gave her that as a one-time dose.  She was still mad and argumentative she stated,"well my pregnancy is high-risk and if anything happens to my baby this hospital will be responsible"  Allowed time for pt to vent and she did calm down and was able to sleep this afternoon and has not c/o any symptoms thus far.Pt was able to be up in the dayroom with her peers and playing cards watching tv laughing and does not appear in distress.  Pt does have an appointment at [facility] for u/s at 1530 [facility] will be here to pick pt up at 1500.  Pt reported her depression 3-4 hopelessness a 2 and her anxiety a 6 on her self-inventory.  She denied any S/H ideation or A/V hallucinations.

## 2012-04-17 NOTE — Progress Notes (Signed)
Patient ID: Alyssa Hansen, female   DOB: 10-10-1990, 22 y.o.   MRN: [id number] D)  Spoke with Dr. Ellyn Hack this evening about the ultrasound that pt is to have scheduled, and she said it would be arranged tomorrow once she knew more about the schedule, and had cancelled the office appt that the pt had tomorrow.  Stated she would be seen by a specialist, as is detoxing.  When this was explained to pt , her main focus was wanting to know if the baby was a boy or girl, wants a boy, and didn't understand why she would be seen by a specialist.  Tried to explain that it was important that the baby be healthy, whether it was a boy or girl, and she responded she wanted a boy.  Tried to explain that the baby was also going thru detox, and she rolled her eyes, asked for hs subutex and walked back over to the peers she had been sitting with. A)  Will continue to monitor for safety, try to develop therapeutic relationship, continue POC R)  Little insight, apathetic, remains safe on unit.

## 2012-04-17 NOTE — Progress Notes (Signed)
Patient did attend the evening speaker AA meeting.  

## 2012-04-17 NOTE — Progress Notes (Addendum)
Patient ID: Alyssa Hansen, female   DOB: 1990/05/09, 22 y.o.   MRN: [id number] [facility] MD Progress Note  04/17/2012 3:22 PM Alyssa Hansen  MRN:  [id number]  Subjective: "I feel hot and cold sweats. My stomach is cramping too. It is the withdrawal kind of cramps. I'm also upset because I was told by the doctor that I will be getting Suboxone 3 times daily. But the nurse told me that it will be only 2 times today. I got it 3 times yesterday. Why can't I have it 3 times today.  If you guys can't give me the Suboxone like I was promised, discharge me so that I will go and start doing my drugs. Using drugs will not harm my child. My child will only be born with withdrawal symptoms. If I start to withdrawal and throw up, it will hurt my baby and it will be this hospital's fault".  Diagnosis:  Opioid dependence/withdrawal  ADL's:  Intact  Sleep: Fair  Appetite:  Fair  Suicidal Ideation:  Plan:  denies Intent:  denies Means:  denies Homicidal Ideation:  Plan:  denies Intent:  denies Means:  denies AEB (as evidenced by):  Psychiatric Specialty Exam: Review of Systems  Constitutional: Positive for diaphoresis.  HENT: Negative.   Eyes: Negative.   Respiratory: Negative.   Cardiovascular: Negative.   Gastrointestinal: Positive for nausea and vomiting.  Genitourinary: Negative.   Musculoskeletal: Negative.   Skin: Negative.   Neurological: Negative.   Endo/Heme/Allergies: Negative.   Psychiatric/Behavioral: Positive for substance abuse. The patient is nervous/anxious.     Blood pressure 97/67, pulse 109, temperature 98.1 F (36.7 C), temperature source Oral, resp. rate 20, height 5\' 5"  (1.651 m), weight 129.729 kg (286 lb).Body mass index is 47.59 kg/(m^2).  General Appearance: Fairly Groomed  Patent attorney::  Fair  Speech:  Clear and Coherent  Volume:  Normal  Mood:  Anxious and worried  Affect:  worried, anxious  Thought Process:  Coherent and Goal Directed  Orientation:  Full (Time,  Place, and Person)  Thought Content:  worries, concerns  Suicidal Thoughts:  No  Homicidal Thoughts:  No  Memory:  Immediate;   Fair Recent;   Fair Remote;   Fair  Judgement:  Fair  Insight:  Present  Psychomotor Activity:  Restlessness  Concentration:  Fair  Recall:  Fair  Akathisia:  No  Handed:  Right  AIMS (if indicated):     Assets:  Desire for Improvement  Sleep:  Number of Hours: 5.75   Current Medications: Current Facility-Administered Medications  Medication Dose Route Frequency Provider Last Rate Last Dose  . acetaminophen (TYLENOL) tablet 650 mg  650 mg Oral Q6H PRN Rachael Fee, MD   650 mg at 04/16/12 0934  . alum & mag hydroxide-simeth (MAALOX/MYLANTA) 200-200-20 MG/5ML suspension 30 mL  30 mL Oral Q4H PRN Rachael Fee, MD      . buprenorphine (SUBUTEX) sublingual tablet 4 mg  4 mg Sublingual BID Rachael Fee, MD   4 mg at 04/17/12 0758  . magnesium hydroxide (MILK OF MAGNESIA) suspension 30 mL  30 mL Oral Daily PRN Rachael Fee, MD      . phenylephrine-shark liver oil-mineral oil-petrolatum (PREPARATION H) rectal ointment   Rectal BID PRN Rachael Fee, MD      . prenatal multivitamin tablet 1 tablet  1 tablet Oral Q1200 Rachael Fee, MD   1 tablet at 04/17/12 1253    Lab Results:  No results  found for this or any previous visit (from the past 48 hour(s)).  Physical Findings: AIMS: Facial and Oral Movements Muscles of Facial Expression: None, normal Lips and Perioral Area: None, normal Jaw: None, normal Tongue: None, normal,Extremity Movements Upper (arms, wrists, hands, fingers): None, normal Lower (legs, knees, ankles, toes): None, normal, Trunk Movements Neck, shoulders, hips: None, normal, Overall Severity Incapacitation due to abnormal movements: None, normal Patient's awareness of abnormal movements (rate only patient's report): No Awareness, Dental Status Current problems with teeth and/or dentures?: No Does patient usually wear dentures?: No   CIWA:  CIWA-Ar Total: 1 COWS:  COWS Total Score: 3  Treatment Plan Summary: Daily contact with patient to assess and evaluate symptoms and progress in treatment Medication management  Plan: Supportive approach/coping skills/relapse prevention Informed patient that Subutex is being used on tapering doses. Made patient aware of her threats towards the hospital. Declined to give Subutex 3 times today. Continue to detox with Subutex on a tapering dose. [facility]. Consider supportive measures like Zofran.  Medical Decision Making Problem Points:  Review of psycho-social stressors (1) Data Points:  Review of medication regiment & side effects (2)  I certify that inpatient services furnished can reasonably be expected to improve the patient's condition.   Armandina Stammer I 04/17/2012, 3:22 PM

## 2012-04-17 NOTE — Tx Team (Signed)
Interdisciplinary Treatment Plan Update (Adult)  Date: 04/17/2012  Time Reviewed: 10:03 AM   Progress in Treatment: Attending groups: Yes Participating in groups: Yes Taking medication as prescribed:  Yes Tolerating medication:  Yes Family/Significant othe contact made: Not as yet Patient understands diagnosis: Yes Discussing patient identified problems/goals with staff: Yes Medical problems stabilized or resolved:  No; patient going for ultra sound on 4/15 Denies suicidal/homicidal ideation: Yes Patient has not harmed self or Others: Yes  New problem(s) identified: None Identified  Discharge Plan or Barriers:  CSW is assessing for appropriate referrals in [place].   Additional comments: N/A  Reason for Continuation of Hospitalization: Anxiety Medical Issues Medication stabilization Withdrawal symptoms   Estimated length of stay: 3-5 days  For review of initial/current patient goals, please see plan of care.  Attendees: Patient:     Family:     Physician:  04/17/2012 10:03 AM   Nursing:   Robbie Louis, RN 04/17/2012 10:03 AM   Clinical Social Worker Ronda Fairly 04/17/2012 10:03 AM   Other:   Dellia Cloud, RN 04/17/2012 10:03 AM   Other:  Margorie John PMBNP-BH 04/17/2012 10:03 AM   Other:  Alla German PA Student 04/17/2012 10:03 AM   Other:  Liliane Bade, Transitional Care Coordinator 04/17/2012 10:03 AM  Other:  Ed[place], MA [employer] Psych Intern  04/17/2012 10:03 AM    Scribe for Treatment Team:   Carney Bern, LCSWA  04/17/2012 10:03 AM

## 2012-04-17 NOTE — [facility] Group Notes (Signed)
[facility] LCSW Aftercare Discharge Planning Group Note   04/17/2012 8:45 AM  Participation Quality:  Adequate  Mood/Affect: Anxious  Depression Rating:  1  Anxiety Rating:  7  Thoughts of Suicide:  No  Will you contract for safety?  NA  Current AVH:  No  Plan for Discharge/Comments:  Patient talking about doctor's appointment tomorrow; uncertain what will happen after that. Support offered.  Patient focused on medications and pregnancy at this time.  Transportation Means: Uncertain  Supports: Family  Harrill, Julious Payer

## 2012-04-18 ENCOUNTER — Ambulatory Visit (HOSPITAL_COMMUNITY)
Admit: 2012-04-18 | Discharge: 2012-04-18 | Disposition: A | Discharge status: Home / Self Care | Payer: BC Managed Care – PPO | Attending: Obstetrics and Gynecology | Admitting: Obstetrics and Gynecology

## 2012-04-18 ENCOUNTER — Ambulatory Visit (HOSPITAL_COMMUNITY): Payer: BC Managed Care – PPO

## 2012-04-18 ENCOUNTER — Encounter (HOSPITAL_COMMUNITY): Payer: Self-pay

## 2012-04-18 DIAGNOSIS — E039 Hypothyroidism, unspecified: Secondary | ICD-10-CM | POA: Insufficient documentation

## 2012-04-18 DIAGNOSIS — E669 Obesity, unspecified: Secondary | ICD-10-CM | POA: Insufficient documentation

## 2012-04-18 DIAGNOSIS — O99280 Endocrine, nutritional and metabolic diseases complicating pregnancy, unspecified trimester: Secondary | ICD-10-CM | POA: Insufficient documentation

## 2012-04-18 DIAGNOSIS — F1410 Cocaine abuse, uncomplicated: Secondary | ICD-10-CM

## 2012-04-18 DIAGNOSIS — O99340 Other mental disorders complicating pregnancy, unspecified trimester: Secondary | ICD-10-CM | POA: Insufficient documentation

## 2012-04-18 DIAGNOSIS — F1910 Other psychoactive substance abuse, uncomplicated: Secondary | ICD-10-CM | POA: Insufficient documentation

## 2012-04-18 DIAGNOSIS — O99330 Smoking (tobacco) complicating pregnancy, unspecified trimester: Secondary | ICD-10-CM | POA: Insufficient documentation

## 2012-04-18 DIAGNOSIS — F1120 Opioid dependence, uncomplicated: Secondary | ICD-10-CM | POA: Insufficient documentation

## 2012-04-18 DIAGNOSIS — O34219 Maternal care for unspecified type scar from previous cesarean delivery: Secondary | ICD-10-CM | POA: Insufficient documentation

## 2012-04-18 DIAGNOSIS — O358XX0 Maternal care for other (suspected) fetal abnormality and damage, not applicable or unspecified: Secondary | ICD-10-CM | POA: Insufficient documentation

## 2012-04-18 DIAGNOSIS — E079 Disorder of thyroid, unspecified: Secondary | ICD-10-CM | POA: Insufficient documentation

## 2012-04-18 DIAGNOSIS — F1920 Other psychoactive substance dependence, uncomplicated: Secondary | ICD-10-CM | POA: Insufficient documentation

## 2012-04-18 MED ORDER — BUPRENORPHINE HCL 8 MG SL SUBL: 4.0000 mg | SUBLINGUAL_TABLET | Freq: Once | SUBLINGUAL | Status: DC

## 2012-04-18 MED ORDER — ONDANSETRON 4 MG PO TBDP
4.0000 mg | ORAL_TABLET | Freq: Three times a day (TID) | ORAL | Status: DC | PRN
  Administered 2012-04-18 – 2012-04-19 (×3): 4 mg via ORAL

## 2012-04-18 MED ORDER — BUPRENORPHINE HCL 2 MG SL SUBL: 4.0000 mg | SUBLINGUAL_TABLET | Freq: Once | SUBLINGUAL | Status: DC

## 2012-04-18 MED ORDER — BUPRENORPHINE HCL 2 MG SL SUBL
4.0000 mg | SUBLINGUAL_TABLET | SUBLINGUAL | Status: DC
  Administered 2012-04-18 – 2012-04-24 (×19): 4 mg via SUBLINGUAL
  Filled 2012-04-18: qty 1
  Filled 2012-04-18 (×11): qty 2
  Filled 2012-04-18: qty 1
  Filled 2012-04-18 (×7): qty 2

## 2012-04-18 NOTE — ED Notes (Signed)
[facility] here to pick up pt and transport back to [facility].

## 2012-04-18 NOTE — Progress Notes (Signed)
Pt was in the bed earlier at shift change taking a nap.  She went to [facility] for an ultrasound to check the status of her pregnancy.  Pt is up now, asking when she can get her next dose of subutex.  Pt was informed that it is due at 2200.  Pt reports she is doing fine at this time.  She says she is having some mild nausea, but wants to take the zofran with her hs meds.  She denies SI/HI/AV.  She smiles when talking about the ultrasound, saying she is glad the baby looks healthy.  She says she now has a good reason to get clean.  Support/encouragement offered.  Discharge plans are still in process.  Safety maintained with q15 minute checks

## 2012-04-18 NOTE — Progress Notes (Signed)
Pt at the med window for her scheduled dose of subutex.  Pt reports she is feeling ok at this time.  She denies any abd pain.  Feels her discomfort is related to her withdrawal symptoms.  Pt has been playing cards with her peers this evening.  Says she is trying to keep her mind off her discomfort.  Pt voices no other needs/concerns.  Pt denies SI/HI/AV.  Pt says she has gone to some groups today.  Pt is unsure about her discharge plans.  Support and encouragement offered.  Safety maintained with q15 minute checks.

## 2012-04-18 NOTE — Progress Notes (Signed)
D: Patient denies SI/HI and A/V hallucinations; patient reports sleep is well; reports appetite is poor; reports energy level is low ; reports ability to pay attention is good ; rates depression as 3/10; rates hopelessness 2/10; complaints of nausea   A: Monitored q 15 minutes; patient encouraged to attend groups; patient educated about medications; patient given medications per physician orders; patient encouraged to express feelings and/or concerns  R: Patient is appropriate to situation but the patient just sleeps most of the day; patient's interaction with staff and peers is appropriate; ; patient is taking medications as prescribed and tolerating medications; patient is attending some of the afternoon groups  Patient also was escorted to [facility] via [facility] for an ultrasound per order

## 2012-04-18 NOTE — Progress Notes (Signed)
[facility] LCSW Aftercare Discharge Planning Group Note   04/18/2012 8:45 AM   Participation Quality:  Did Not Attend  Clide Dales

## 2012-04-18 NOTE — Progress Notes (Signed)
Recreation Therapy Notes  Date: 04.15.2014  Time: 3:00pm  Location: [address] Day Room   Group Topic/Focus: Decision Making & Goal Setting   Participation Level:  Active   Participation Quality:  Appropriate   Affect:  Euthymic   Cognitive:  Appropriate   Additional Comments: Activity: Choices in a Jar & [employer]; Explanation: Choices in a Jar - Patients were asked a series of either or questions from Choices in a Jar. Patients were asked to choice one option out of the two provided. [place] [employer] - A beach ball was placed between patient and peer or LRT hips. Patients were asked to set a goal of how far they could walk without the ball falling to the floor. Patients were instructed not to touch the ball or each other to complete this task.   Patient actively participated in Choices in a Jar. Patient participated in group discussion about making good choices. Patient actively participated in [employer]. Patient with peer set goal of making it to the end of 300 hallway. Patient and peer successful at reaching this goal. When patient and peer reached their first goal the set a goal of making back to the starting place. Patient and peer were unsuccessful at reaching this goal. Patient participated in discussion about goal setting effecting sobriety.   Marykay Lex Yahya Boldman, LRT/CTRS   Jhaden Pizzuto L 04/18/2012 3:52 PM

## 2012-04-18 NOTE — Progress Notes (Signed)
[facility] LCSW Group Therapy  04/18/2012 1:15 PM  Type of Therapy:  Group Therapy 1:15 to 2:30PM  Participation Level:  Active  Participation Quality:  Appropriate and Attentive  Affect:  Appropriate  Cognitive:  Appropriate  Insight:  Developing/Improving  Engagement in Therapy:  Developing/Improving  Modes of Intervention:  Discussion, Education and Support  Summary of Progress/Problems: Patient attended group presentation by staff member of  [employer]  ([employer]). Jaquanda was attentive and appropriate during session and appeared interested  Latesha Chesney, Julious Payer

## 2012-04-18 NOTE — [facility] Counselor (Signed)
Adult Comprehensive Assessment  Patient ID: Alyssa Hansen, female   DOB: [birth year], 22 y.o.   MRN: [id number]  Information Source: Information source: Patient  Current Stressors:  Educational / Learning stressors: NA Employment / Job issues: Unemployed Family Relationships: Some strain due to patient's drug use Surveyor, quantity / Lack of resources (include bankruptcy): Some strain Housing / Lack of housing: NA Physical health (include injuries & life threatening diseases): [redacted] weeks pregnant; picking at skin 2 years Social relationships: All patient's sober friends are in [place] Substance abuse: Ongoing Bereavement / Loss: NA  Living/Environment/Situation:  Living Arrangements: Parent Living conditions (as described by patient or guardian): "Everything is okay unless I am using; I create a lot of chaos when I use" How long has patient lived in current situation?: 5 months What is atmosphere in current home: Chaotic;Comfortable  Family History:  Marital status: Single Does patient have children?: Yes How many children?: 1 How is patient's relationship with their children?: "Good, but she thinks of me more as a playmate since my mother has had her for most of the time."  Childhood History:  By whom was/is the patient raised?: Both parents Additional childhood history information: Parents divorced when patient was 22 YO Description of patient's relationship with caregiver when they were a child: Difficult with father; Molli Knock with Mother Patient's description of current relationship with people who raised him/her: Improving with father, still difficult with mother Does patient have siblings?: Yes Number of Siblings: 2 Description of patient's current relationship with siblings: "We get along ok okay if I am not using" Did patient suffer any verbal/emotional/physical/sexual abuse as a child?: No Did patient suffer from severe childhood neglect?: No Has patient ever been sexually  abused/assaulted/raped as an adolescent or adult?: No Was the patient ever a victim of a crime or a disaster?: No Witnessed domestic violence?: No Has patient been effected by domestic violence as an adult?: No  Education:  Highest grade of school patient has completed: 12 Currently a Consulting civil engineer?: No Learning disability?: No  Employment/Work Situation:   Employment situation: Unemployed Patient's job has been impacted by current illness: No What is the longest time patient has a held a job?: 4 months Where was the patient employed at that time?: [employer] in [place] Has patient ever been in the [employer]?: No Has patient ever served in Buyer, retail?: No  Financial Resources:   Financial resources: No income  Alcohol/Substance Abuse:   What has been your use of drugs/alcohol within the last 12 months?: Oxycodone, using 60-200 mg  daily for last  6 weeks. Ongoing use for 4 months yet increased greatly the last 6 weeks.  If attempted suicide, did drugs/alcohol play a role in this?:  (NA) Alcohol/Substance Abuse Treatment Hx: Past Tx, Inpatient If yes, describe treatment: [facility] in [place] 2013 and twice in [place] [facility] 2012 and 2013 Has alcohol/substance abuse ever caused legal problems?: Yes (Common law robbery and probation violation, PO Nelda Bucks)  Social Support System:   Patient's Community Support System: Fair Development worker, community Support System: Mother and grandmother Type of faith/religion: Ephriam Knuckles How does patient's faith help to cope with current illness?: "It helps"  Leisure/Recreation:   Leisure and Hobbies: "Nothing really"  Strengths/Needs:   What things does the patient do well?: "I don't really" In what areas does patient struggle / problems for patient: Staying clean  Discharge Plan:   Does patient have access to transportation?: Yes (Not consistently) Will patient be returning to same  living situation after discharge?:  Yes Currently receiving community mental health services: Yes (From Whom) Caren Griffins in [place] twice monthly) Does patient have financial barriers related to discharge medications?: Yes Patient description of barriers related to discharge medications: No Income  Summary/Recommendations:   Summary and Recommendations (to be completed by the evaluator): Patient is 22 YO unemployed caucasian female admitted with diagnosis of Bipolar Depressed and Opoid Dependence. Patient would benefit from crisis stabilization, medication evaluation, therapy groups for processing thoughts/feelings/experiences, psycho ed groups for coping skills, and case management for discharge planning   Clide Dales.

## 2012-04-18 NOTE — Progress Notes (Signed)
[facility] MD Progress Note  04/18/2012 5:48 PM Alyssa Hansen  MRN:  [id number] Subjective:  Alyssa Hansen has been having a hard time. She is still experiencing S/S withdrawal. Will continue to optimize treatment with Subutex, and will address the nausea. She has an appointment with OB today Diagnosis:  Opioid Dependence/withdrawal, Cocaine abuse  ADL's:  Intact  Sleep: Fair  Appetite:  Poor  Suicidal Ideation:  Plan:  denies Intent:  denies Means:  denies Homicidal Ideation:  Plan:  denies Intent:  denies Means:  denies AEB (as evidenced by):  Psychiatric Specialty Exam: Review of Systems  Constitutional: Positive for malaise/fatigue.  HENT: Negative.   Gastrointestinal: Positive for nausea and vomiting.  Genitourinary: Negative.   Musculoskeletal: Negative.   Skin: Negative.   Neurological: Negative.   Endo/Heme/Allergies: Negative.   Psychiatric/Behavioral: Positive for depression and substance abuse. The patient is nervous/anxious.     Blood pressure 110/76, pulse 98, temperature 98.2 F (36.8 C), temperature source Oral, resp. rate 18, height 5\' 5"  (1.651 m), weight 129.729 kg (286 lb), last menstrual period 11/19/2011.Body mass index is 47.59 kg/(m^2).  General Appearance: Disheveled  Eye Solicitor::  Fair  Speech:  Clear and Coherent  Volume:  Decreased  Mood:  Anxious, Depressed and uncomfortable  Affect:  anxious, uncomfortable  Thought Process:  Coherent and Goal Directed  Orientation:  Full (Time, Place, and Person)  Thought Content:  withdrawal symptoms, concens  Suicidal Thoughts:  No  Homicidal Thoughts:  No  Memory:  Immediate;   Fair Recent;   Fair Remote;   Fair  Judgement:  Fair  Insight:  Present  Psychomotor Activity:  Restlessness  Concentration:  Fair  Recall:  Fair  Akathisia:  No  Handed:  Right  AIMS (if indicated):     Assets:  Desire for Improvement  Sleep:  Number of Hours: 5.5   Current Medications: Current Facility-Administered Medications   Medication Dose Route Frequency Provider Last Rate Last Dose  . acetaminophen (TYLENOL) tablet 650 mg  650 mg Oral Q6H PRN Rachael Fee, MD   650 mg at 04/16/12 0934  . alum & mag hydroxide-simeth (MAALOX/MYLANTA) 200-200-20 MG/5ML suspension 30 mL  30 mL Oral Q4H PRN Rachael Fee, MD      . buprenorphine (SUBUTEX) SL tablet 4 mg  4 mg Sublingual [id number] Rachael Fee, MD   4 mg at 04/18/12 1420  . magnesium hydroxide (MILK OF MAGNESIA) suspension 30 mL  30 mL Oral Daily PRN Rachael Fee, MD      . ondansetron (ZOFRAN-ODT) disintegrating tablet 4 mg  4 mg Oral Q8H PRN Rachael Fee, MD   4 mg at 04/18/12 1220  . phenylephrine-shark liver oil-mineral oil-petrolatum (PREPARATION H) rectal ointment   Rectal BID PRN Rachael Fee, MD      . prenatal multivitamin tablet 1 tablet  1 tablet Oral Q1200 Rachael Fee, MD   1 tablet at 04/18/12 1220    Lab Results: No results found for this or any previous visit (from the past 48 hour(s)).  Physical Findings: AIMS: Facial and Oral Movements Muscles of Facial Expression: None, normal Lips and Perioral Area: None, normal Jaw: None, normal Tongue: None, normal,Extremity Movements Upper (arms, wrists, hands, fingers): None, normal Lower (legs, knees, ankles, toes): None, normal, Trunk Movements Neck, shoulders, hips: None, normal, Overall Severity Incapacitation due to abnormal movements: None, normal Patient's awareness of abnormal movements (rate only patient's report): No Awareness, Dental Status Current problems with teeth and/or dentures?: No  Does patient usually wear dentures?: No  CIWA:  CIWA-Ar Total: 1 COWS:  COWS Total Score: 4  Treatment Plan Summary: Daily contact with patient to assess and evaluate symptoms and progress in treatment Medication management  Plan: Supportive approach/coping skills           Optimize treatment with Subutex            Address the nausea           OB evaluation  Medical Decision Making Problem  Points:  Review of last therapy session (1) and Review of psycho-social stressors (1) Data Points:  Review of medication regiment & side effects (2) Review of new medications or change in dosage (2)  I certify that inpatient services furnished can reasonably be expected to improve the patient's condition.   Surafel Hilleary A 04/18/2012, 5:48 PM

## 2012-04-19 DIAGNOSIS — F1994 Other psychoactive substance use, unspecified with psychoactive substance-induced mood disorder: Secondary | ICD-10-CM

## 2012-04-19 MED ORDER — ONDANSETRON 4 MG PO TBDP
4.0000 mg | ORAL_TABLET | Freq: Four times a day (QID) | ORAL | Status: DC | PRN
  Administered 2012-04-19 – 2012-04-22 (×6): 4 mg via ORAL
  Filled 2012-04-19 (×4): qty 1

## 2012-04-19 NOTE — Progress Notes (Signed)
[facility] LCSW Group Therapy  04/19/2012 1:15 PM  Type of Therapy: Group Therapy 1:15 to 2:30 PM  Participation Level: Did Not Attend  Harrill, Catherine Campbell   

## 2012-04-19 NOTE — Progress Notes (Signed)
Patient ID: Alyssa Hansen, female   DOB: December 23, 1990, 22 y.o.   MRN: [id number] Patient was discussed briefly in treatment team; expected length of stay has increased to 7 days.  CSW to explore treat,ment options for pregnant patient.  Carney Bern, LCSWA
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
## 2012-04-19 NOTE — Progress Notes (Signed)
[facility] LCSW Aftercare Discharge Planning Group Note   04/19/2012 8:45 AM   Participation Quality:  Did Not Attend  Alyssa Hansen  

## 2012-04-19 NOTE — Progress Notes (Signed)
Pt has been in bed asleep most of the day. She rated her depression a 5 hopelessness a 3 anxiety 4-5.  She still c/o cravings and nausea.  She denies any S/H ideation or A/V hallucination.  She went down for dinner tonight and vomited up most of her food.  She came back but denied the need for zofran and just wanted to lay back down.

## 2012-04-19 NOTE — Progress Notes (Signed)
Adult Psychoeducational Group Note  Date:  04/19/2012 Time:  10:38 PM  Group Topic/Focus:  NA  Participation Level:  Active  Participation Quality:  Appropriate  Affect:  Appropriate  Cognitive:  Alert  Insight: Appropriate  Engagement in Group:  Improving  Modes of Intervention:  Discussion  Additional Comments:    Flonnie Hailstone 04/19/2012, 10:38 PM

## 2012-04-20 NOTE — Progress Notes (Signed)
[facility] LCSW Group Therapy  04/20/2012 1:15 PM  Type of Therapy:  Group Therapy 1:15 to 2:30  Participation Level:  Did Not Attend   Clide Dales

## 2012-04-20 NOTE — Progress Notes (Signed)
Patient did attend the evening karaoke group. Pt was attentive and supportive.   

## 2012-04-20 NOTE — Progress Notes (Signed)
[facility] MD Progress Note  04/20/2012 3:53 PM Alyssa Hansen  MRN:  [id number] Subjective:  Little endorses that she is having violent nightmares . She is also having dreams of using. She is having some nausea but the Zofran does help. Still not sure if she wants to pursue the Subutex on a maintenance basis. States that her mother is afraid that she could relapsed on IV use without it. She is concerned about that possibility herself.  Diagnosis:  Opioid Dependence/withdrawal, cocaine abuse, substance induced mood disorder  ADL's:  Intact  Sleep: Fair  Appetite:  Fair  Suicidal Ideation:  Plan:  denies Intent:  denies Means:  denies Homicidal Ideation:  Plan:  denies Intent:  denies Means:  denies AEB (as evidenced by):  Psychiatric Specialty Exam: Review of Systems  Constitutional: Negative.   HENT: Negative.   Eyes: Negative.   Respiratory: Negative.   Cardiovascular: Negative.   Gastrointestinal: Positive for nausea.  Genitourinary: Negative.   Musculoskeletal: Positive for back pain.  Skin: Negative.   Neurological: Negative.   Endo/Heme/Allergies: Negative.   Psychiatric/Behavioral: Positive for substance abuse. The patient is nervous/anxious.     Blood pressure 111/72, pulse 105, temperature 99 F (37.2 C), temperature source Oral, resp. rate 16, height 5\' 5"  (1.651 m), weight 129.729 kg (286 lb), last menstrual period 11/19/2011.Body mass index is 47.59 kg/(m^2).  General Appearance: Fairly Groomed  Patent attorney::  Fair  Speech:  Clear and Coherent and Slow  Volume:  Decreased  Mood:  Anxious and worried  Affect:  worry  Thought Process:  Coherent and Goal Directed  Orientation:  Full (Time, Place, and Person)  Thought Content:  worries, concerns, symtpom focused  Suicidal Thoughts:  No  Homicidal Thoughts:  No  Memory:  Immediate;   Fair Recent;   Fair Remote;   Fair  Judgement:  Fair  Insight:  Present  Psychomotor Activity:  Restlessness  Concentration:  Fair   Recall:  Fair  Akathisia:  No  Handed:  Right  AIMS (if indicated):     Assets:  Desire for Improvement  Sleep:  Number of Hours: 5.5   Current Medications: Current Facility-Administered Medications  Medication Dose Route Frequency Provider Last Rate Last Dose  . acetaminophen (TYLENOL) tablet 650 mg  650 mg Oral Q6H PRN Rachael Fee, MD   650 mg at 04/16/12 0934  . alum & mag hydroxide-simeth (MAALOX/MYLANTA) 200-200-20 MG/5ML suspension 30 mL  30 mL Oral Q4H PRN Rachael Fee, MD      . buprenorphine (SUBUTEX) SL tablet 4 mg  4 mg Sublingual [id number] Rachael Fee, MD   4 mg at 04/20/12 1405  . magnesium hydroxide (MILK OF MAGNESIA) suspension 30 mL  30 mL Oral Daily PRN Rachael Fee, MD   30 mL at 04/19/12 2148  . ondansetron (ZOFRAN-ODT) disintegrating tablet 4 mg  4 mg Oral Q6H PRN Rachael Fee, MD   4 mg at 04/20/12 0656  . phenylephrine-shark liver oil-mineral oil-petrolatum (PREPARATION H) rectal ointment   Rectal BID PRN Rachael Fee, MD      . prenatal multivitamin tablet 1 tablet  1 tablet Oral Q1200 Rachael Fee, MD   1 tablet at 04/20/12 1142    Lab Results: No results found for this or any previous visit (from the past 48 hour(s)).  Physical Findings: AIMS: Facial and Oral Movements Muscles of Facial Expression: None, normal Lips and Perioral Area: None, normal Jaw: None, normal Tongue: None, normal,Extremity Movements Upper (  arms, wrists, hands, fingers): None, normal Lower (legs, knees, ankles, toes): None, normal, Trunk Movements Neck, shoulders, hips: None, normal, Overall Severity Incapacitation due to abnormal movements: None, normal Patient's awareness of abnormal movements (rate only patient's report): No Awareness, Dental Status Current problems with teeth and/or dentures?: No Does patient usually wear dentures?: No  CIWA:  CIWA-Ar Total: 1 COWS:  COWS Total Score: 4  Treatment Plan Summary: Daily contact with patient to assess and evaluate  symptoms and progress in treatment Medication management  Plan: Supportive approach/coping skills/relapse prevention           Continue Subutex 4 mg TID/provide more information about Suboxone Maintenance           Reassess co morbidites  Medical Decision Making Problem Points:  Review of last therapy session (1) and Review of psycho-social stressors (1) Data Points:  Review of medication regiment & side effects (2)  I certify that inpatient services furnished can reasonably be expected to improve the patient's condition.   Kipling Graser A 04/20/2012, 3:53 PM

## 2012-04-20 NOTE — Progress Notes (Signed)
Patient ID: Alyssa Hansen, female   DOB: 04-26-1990, 22 y.o.   MRN: [id number] She  Has been up between groups and in bed during groups. Has c/o nausea but has not requested Zofran. She feels the nausea is due her pregnancy.

## 2012-04-20 NOTE — Progress Notes (Addendum)
Patient ID: Alyssa Hansen, female   DOB: 07-Mar-1990, 22 y.o.   MRN: [id number]  D: Pt denies SI/HI/AVH. Pt is pleasant and cooperative. Pt seems in better spirits, and happier than previous days. Pt jokes with Clinical research associate, and is in good mood most of the night. Pt says she will try to do right for this baby.  A: Pt was offered support and encouragement. Pt was given scheduled medications. Pt was encourage to attend groups. Q 15 minute checks were done for safety.   R:Pt attends groups and interacts well with peers and staff. Pt is taking medication. Pt has no complaints at this time.Pt receptive to treatment and safety maintained on unit.

## 2012-04-20 NOTE — Progress Notes (Addendum)
[facility] LCSW Aftercare Discharge Planning Group Note   04/20/2012 8:45 AM Participation Quality:  Did not attend  Alyssa Hansen

## 2012-04-20 NOTE — Progress Notes (Signed)
Pt reports she is feeling tired this evening.  She says her nausea is getting the best of her.  She says she is trying to hold off taking any medicine until bedtime.  Gave pt a cup of ginger ale to settle her stomach.  Pt says otherwise she is doing ok.  Pt denies SI/HI/AV.  She says she is still having cravings.  Pt is unsure of her discharge plans.  Support/encoouragement offered.  Safety maintained with q15 minute checks.

## 2012-04-21 DIAGNOSIS — F411 Generalized anxiety disorder: Secondary | ICD-10-CM

## 2012-04-21 NOTE — Progress Notes (Signed)
Patient ID: Alyssa Hansen, female   DOB: 30-Jan-1990, 22 y.o.   MRN: [id number] D- Patient reports fair sleep and improving appetite.  Her energy level is low and she rates her depression and her hopelessness at 3.  She had a headache this am but did not want tylenol.  She thought it would go away with rest.  She has been feeling nauseated and sipping on ginger ale.  She ate small amount at lunch. She is attending groups.  A- Encouraged pt to think about her goals and what she plans to work on today.  A-Patient plans to think about things that have brought her pleasure in the past and think about ways she can recreate them.

## 2012-04-21 NOTE — Progress Notes (Signed)
[facility] MD Progress Note  04/21/2012 1:03 PM Alyssa Hansen  MRN:  [id number] Subjective:  Alyssa Hansen is still experiencing nausea. Other symptoms are under better control. She is concerned about being detox from the Subutex. She is feeling much better. Afraid of relapsing if she was to go off it. She continues to have some nightmares, some anxieties. She is committed to abstain for what she will do anything we suggest. She wants to get right for herself and her child Diagnosis:  Opioid Dependence/withdrawal, Cocaine Abuse. Anxiety Disorder NOS  ADL's:  Intact  Sleep: Fair  Appetite:  Fair  Suicidal Ideation:  Plan:  denies Intent:  denies Means:  denies Homicidal Ideation:  Plan:  denies Intent:  denies Means:  denies AEB (as evidenced by):  Psychiatric Specialty Exam: Review of Systems  Constitutional: Negative.   HENT: Negative.   Eyes: Negative.   Respiratory: Negative.   Cardiovascular: Negative.   Gastrointestinal: Positive for nausea.  Genitourinary: Negative.   Musculoskeletal: Negative.   Skin: Negative.   Neurological: Negative.   Endo/Heme/Allergies: Negative.   Psychiatric/Behavioral: Positive for substance abuse. The patient is nervous/anxious.     Blood pressure 144/79, pulse 92, temperature 97.9 F (36.6 C), temperature source Oral, resp. rate 24, height 5\' 5"  (1.651 m), weight 129.729 kg (286 lb), last menstrual period 11/19/2011.Body mass index is 47.59 kg/(m^2).  General Appearance: Fairly Groomed  Patent attorney::  Fair  Speech:  Clear and Coherent  Volume:  Normal  Mood:  Anxious and worried  Affect:  anxious  Thought Process:  Coherent and Goal Directed  Orientation:  Full (Time, Place, and Person)  Thought Content:  worries, concerns  Suicidal Thoughts:  No  Homicidal Thoughts:  No  Memory:  Immediate;   Fair Recent;   Fair Remote;   Fair  Judgement:  Fair  Insight:  Present  Psychomotor Activity:  Restlessness  Concentration:  Fair  Recall:  Fair   Akathisia:  No  Handed:  Right  AIMS (if indicated):     Assets:  Desire for Improvement  Sleep:  Number of Hours: 5.5   Current Medications: Current Facility-Administered Medications  Medication Dose Route Frequency Provider Last Rate Last Dose  . acetaminophen (TYLENOL) tablet 650 mg  650 mg Oral Q6H PRN Rachael Fee, MD   650 mg at 04/16/12 0934  . alum & mag hydroxide-simeth (MAALOX/MYLANTA) 200-200-20 MG/5ML suspension 30 mL  30 mL Oral Q4H PRN Rachael Fee, MD   30 mL at 04/20/12 2200  . buprenorphine (SUBUTEX) SL tablet 4 mg  4 mg Sublingual [id number] Rachael Fee, MD   4 mg at 04/21/12 4098  . magnesium hydroxide (MILK OF MAGNESIA) suspension 30 mL  30 mL Oral Daily PRN Rachael Fee, MD   30 mL at 04/19/12 2148  . ondansetron (ZOFRAN-ODT) disintegrating tablet 4 mg  4 mg Oral Q6H PRN Rachael Fee, MD   4 mg at 04/21/12 [phone]  . phenylephrine-shark liver oil-mineral oil-petrolatum (PREPARATION H) rectal ointment   Rectal BID PRN Rachael Fee, MD      . prenatal multivitamin tablet 1 tablet  1 tablet Oral Q1200 Rachael Fee, MD   1 tablet at 04/21/12 1154    Lab Results: No results found for this or any previous visit (from the past 48 hour(s)).  Physical Findings: AIMS: Facial and Oral Movements Muscles of Facial Expression: None, normal Lips and Perioral Area: None, normal Jaw: None, normal Tongue: None, normal,Extremity Movements Upper (arms,  wrists, hands, fingers): None, normal Lower (legs, knees, ankles, toes): None, normal, Trunk Movements Neck, shoulders, hips: None, normal, Overall Severity Incapacitation due to abnormal movements: None, normal Patient's awareness of abnormal movements (rate only patient's report): No Awareness, Dental Status Current problems with teeth and/or dentures?: No Does patient usually wear dentures?: No  CIWA:  CIWA-Ar Total: 1 COWS:  COWS Total Score: 4  Treatment Plan Summary: Daily contact with patient to assess and evaluate  symptoms and progress in treatment Medication management  Plan: Supportive approach/coping skills/relapse prevention           Continue Subutex 4 mg TID           Identife a clinic she can go to keep the Subutex on maintenance basis  Medical Decision Making Problem Points:  Review of last therapy session (1) and Review of psycho-social stressors (1) Data Points:  Review of medication regiment & side effects (2)  I certify that inpatient services furnished can reasonably be expected to improve the patient's condition.   Alyssa Hansen A 04/21/2012, 1:03 PM

## 2012-04-21 NOTE — Progress Notes (Signed)
Adult Psychoeducational Group Note  Date:  04/21/2012 Time:  12:14 PM  Group Topic/Focus:  Relapse Prevention Planning:   The focus of this group is to define relapse and discuss the need for planning to combat relapse.  Participation Level:  Minimal  Participation Quality:  Appropriate  Affect:  Appropriate  Cognitive:  Oriented  Insight: Good  Engagement in Group:  Engaged  Modes of Intervention:  Discussion and Education  Additional Comments:  Pt is in treatment for alcohol dependence.  Alyssa Hansen T 04/21/2012, 12:14 PM

## 2012-04-21 NOTE — Progress Notes (Signed)
Patient ID: Alyssa Hansen, female   DOB: 1990-05-11, 22 y.o.   MRN: [id number]  D: Pt denies SI/HI/AVH. Pt is pleasant and cooperative. Pt "I plan on doing volunteer work through [employer], I think, whatever, she scheduled it". Pt motivated to make the change needed to stay on the right path.  A: Pt was offered support and encouragement. Pt was given scheduled medications. Pt was encourage to attend groups. Q 15 minute checks were done for safety.   R:Pt attends groups and interacts well with peers and staff. Pt is taking medication. Pt has no complaints at this time.Pt receptive to treatment and safety maintained on unit.

## 2012-04-21 NOTE — Progress Notes (Signed)
[facility] LCSW Group Therapy  04/21/2012 1:15 PM  Type of Therapy:  Group Therapy 1:15 to 2:30 PM  Participation Level: Appropriate  Participation Quality: Attentive  Affect:  Appropriate  Cognitive:  Alert and oriented  Insight: Developing  Engagement in Therapy: Developing  Modes of Intervention: Discussion, Exploration, Education and Support  Summary of Progress/Problems: Group session included an educational portion on Post Acute Withdrawal Syndrome (PAWS).  Patients were able to process the information and share feelings related to length of time recovery takes. [name] was present for entire group and shared with humor difficulty of early recovery.    Clide Dales

## 2012-04-21 NOTE — Progress Notes (Signed)
[facility] LCSW Aftercare Discharge Planning Group Note   04/21/2012 8:45 AM  Participation Quality:  Did Not Attend   Harrill, Catherine Campbell 

## 2012-04-21 NOTE — Progress Notes (Signed)
Patient ID: Alyssa Hansen, female   DOB: 12-Nov-1990, 22 y.o.   MRN: [id number] CSW spoke with patient's mother Steward Drone who is supportive of discharge plan for [name] to followup with drug replacement therapy.  Mobile crisis services explained and contact information placed in chart for pt to receive at discharge. Carney Bern, LCSWA

## 2012-04-21 NOTE — Tx Team (Signed)
Interdisciplinary Treatment Plan Update (Adult)  Date: 04/21/2012  Time Reviewed: 10:05 AM   Progress in Treatment: Attending groups: Not as yet Participating in groups: Not as yet Taking medication as prescribed:  Yes Tolerating medication:  Yes Family/Significant othe contact made: No Patient understands diagnosis: Yes Discussing patient identified problems/goals with staff: Yes Medical problems stabilized or resolved:  Yes Denies suicidal/homicidal ideation: Yes Patient has not harmed self or Others: Yes  New problem(s) identified: None Identified  Discharge Plan or Barriers:  CSW will call for appointment for drug replacement therapy  Additional comments: N/A  Reason for Continuation of Hospitalization: Anxiety Depression Medical Issues Medication stabilization     Estimated length of stay: 3 days; discharge Monday  For review of initial/current patient goals, please see plan of care.  Attendees: Patient:     Family:     Physician:  Geoffery Lyons 04/21/2012 10:05 AM   Nursing:      Clinical Social Worker Ronda Fairly 04/21/2012 10:05 AM   Other:  Alla German PA Student 04/21/2012 10:05 AM   Other:  Serena Colonel, PA 04/21/2012 10:05 AM   Other:  Neill Loft, RN 04/21/2012 10:05 AM   Other:  Dahlia Byes, PA 04/21/2012 10:05 AM    Scribe for Treatment Team:   Carney Bern, LCSWA  04/21/2012 10:05 AM

## 2012-04-22 NOTE — [facility] Group Notes (Signed)
[facility] Group Notes: (Clinical Social Work)   04/22/2012      Type of Therapy:  Group Therapy   Participation Level:  Did Not Attend    Ambrose Mantle, LCSW 04/22/2012, 12:18 PM

## 2012-04-22 NOTE — Progress Notes (Signed)
Patient ID: ATHALIAH BAUMBACH, female   DOB: March 18, 1990, 22 y.o.   MRN: [id number] Psychoeducational Group Note  Date:  04/22/2012 Time:1000am  Group Topic/Focus:  Identifying Needs:   The focus of this group is to help patients identify their personal needs that have been historically problematic and identify healthy behaviors to address their needs.  Participation Level:  Did Not Attend  Participation Quality:    Affect:   Cognitive:  Insight:  Engagement in Group:    Additional Comments: inventory group and healthy coping skills.   Valente David 04/22/2012,12:40 PM

## 2012-04-22 NOTE — Progress Notes (Signed)
Patient ID: Alyssa Hansen, female   DOB: Dec 18, 1990, 22 y.o.   MRN: [id number]  D: Pt denies SI/HI/AVH, Pt states she has little pain in her back, that no one can do anything about. Pt is pleasant and cooperative. Pt states she is "all right, and had a good day". Pt saw her other daughter during lunch, and said she strained her back picking her up. Pt seems in good spirits tonight, and says she is ready to leave. Pt states she wants to continue smoking when she gets out.   A: Pt was offered support and encouragement. Pt was given scheduled medications. Pt was encourage to attend groups. Q 15 minute checks were done for safety. Pt offered Heat/ cold pack for back, but pt denied. Pt educated on the dangers of smoking while pregnant.   R:Pt attends groups and interacts well with peers and staff. Pt is taking medication. Pt has no complaints.Pt receptive to treatment and safety maintained on unit.

## 2012-04-22 NOTE — Progress Notes (Signed)
Patient did attend the evening speaker AA meeting.  

## 2012-04-22 NOTE — Progress Notes (Signed)
Psychoeducational Group Note  Date:  04/22/2012 Time:  0945 am  Group Topic/Focus:  Identifying Needs:   The focus of this group is to help patients identify their personal needs that have been historically problematic and identify healthy behaviors to address their needs.  Participation Level:  Minimal  Participation Quality:  Appropriate  Affect:  Appropriate  Cognitive:  Alert  Insight:  Limited  Engagement in Group:  Limited  Additional Comments:  Pt left group early she said she threw up in her bathroom and then was excused from the rest of group  Andrena Mews 04/22/2012,2:28 PM

## 2012-04-22 NOTE — Progress Notes (Signed)
Patient ID: Alyssa Hansen, female   DOB: 1990/07/21, 22 y.o.   MRN: [id number] [facility] MD Progress Note  04/22/2012 2:12 PM KYLIANA Hansen  MRN:  [id number]  Subjective:  Alyssa reports that she having some abdominal discomforts, thinks it is part of her morning sickness. Believed that Subutex is helping her cravings. Is not sleeping well at night. Does not feel depressed or suicidal".  Diagnosis:  Opioid Dependence/withdrawal, Cocaine Abuse. Anxiety Disorder NOS  ADL's:  Intact  Sleep: Fair  Appetite:  Fair  Suicidal Ideation:  Plan:  denies Intent:  denies Means:  denies Homicidal Ideation:  Plan:  denies Intent:  denies Means:  denies AEB (as evidenced by):  Psychiatric Specialty Exam: Review of Systems  Constitutional: Negative.   HENT: Negative.   Eyes: Negative.   Respiratory: Negative.   Cardiovascular: Negative.   Gastrointestinal: Positive for nausea.  Genitourinary: Negative.   Musculoskeletal: Negative.   Skin: Negative.   Neurological: Negative.   Endo/Heme/Allergies: Negative.   Psychiatric/Behavioral: Positive for substance abuse. The patient is nervous/anxious.     Blood pressure 118/71, pulse 101, temperature 98.2 F (36.8 C), temperature source Oral, resp. rate 16, height 5\' 5"  (1.651 m), weight 129.729 kg (286 lb), last menstrual period 11/19/2011.Body mass index is 47.59 kg/(m^2).  General Appearance: Fairly Groomed  Patent attorney::  Fair  Speech:  Clear and Coherent  Volume:  Normal  Mood:  Anxious and worried  Affect:  anxious  Thought Process:  Coherent and Goal Directed  Orientation:  Full (Time, Place, and Person)  Thought Content:  worries, concerns  Suicidal Thoughts:  No  Homicidal Thoughts:  No  Memory:  Immediate;   Fair Recent;   Fair Remote;   Fair  Judgement:  Fair  Insight:  Present  Psychomotor Activity:  Restlessness  Concentration:  Fair  Recall:  Fair  Akathisia:  No  Handed:  Right  AIMS (if indicated):     Assets:  Desire  for Improvement  Sleep:  Number of Hours: 6   Current Medications: Current Facility-Administered Medications  Medication Dose Route Frequency Provider Last Rate Last Dose  . acetaminophen (TYLENOL) tablet 650 mg  650 mg Oral Q6H PRN Rachael Fee, MD   650 mg at 04/16/12 0934  . alum & mag hydroxide-simeth (MAALOX/MYLANTA) 200-200-20 MG/5ML suspension 30 mL  30 mL Oral Q4H PRN Rachael Fee, MD   30 mL at 04/20/12 2200  . buprenorphine (SUBUTEX) SL tablet 4 mg  4 mg Sublingual [id number] Rachael Fee, MD   4 mg at 04/22/12 1347  . magnesium hydroxide (MILK OF MAGNESIA) suspension 30 mL  30 mL Oral Daily PRN Rachael Fee, MD   30 mL at 04/19/12 2148  . ondansetron (ZOFRAN-ODT) disintegrating tablet 4 mg  4 mg Oral Q6H PRN Rachael Fee, MD   4 mg at 04/22/12 1214  . phenylephrine-shark liver oil-mineral oil-petrolatum (PREPARATION H) rectal ointment   Rectal BID PRN Rachael Fee, MD      . prenatal multivitamin tablet 1 tablet  1 tablet Oral Q1200 Rachael Fee, MD   1 tablet at 04/22/12 1214    Lab Results: No results found for this or any previous visit (from the past 48 hour(s)).  Physical Findings: AIMS: Facial and Oral Movements Muscles of Facial Expression: None, normal Lips and Perioral Area: None, normal Jaw: None, normal Tongue: None, normal,Extremity Movements Upper (arms, wrists, hands, fingers): None, normal Lower (legs, knees, ankles, toes): None, normal, Trunk  Movements Neck, shoulders, hips: None, normal, Overall Severity Incapacitation due to abnormal movements: None, normal Patient's awareness of abnormal movements (rate only patient's report): No Awareness, Dental Status Current problems with teeth and/or dentures?: No Does patient usually wear dentures?: No  CIWA:  CIWA-Ar Total: 1 COWS:  COWS Total Score: 4  Treatment Plan Summary: Daily contact with patient to assess and evaluate symptoms and progress in treatment Medication management  Plan: Supportive  approach/coping skills/relapse prevention. Identife a clinic she can go to keep the Subutex on maintenance basis. Encouraged group sessions participation.  Medical Decision Making Problem Points:  Review of last therapy session (1) and Review of psycho-social stressors (1) Data Points:  Review of medication regiment & side effects (2)  I certify that inpatient services furnished can reasonably be expected to improve the patient's condition.   Armandina Stammer I 04/22/2012, 2:12 PM

## 2012-04-22 NOTE — Progress Notes (Signed)
Patient ID: Alyssa Hansen, female   DOB: 01/16/1990, 22 y.o.   MRN: [id number] 04-22-12 nursing shift note: D: pt has taken her medications, made some of her groups and been moderately visible in the milieu. A: she stated the subutex is helping her cravings.she has bouts of n/v and was administered Zofran prn. This pt is [redacted] weeks pregnant R:  On her inventory sheet she wrote her sleep is poor, appetite improving, energy low, attention is good. Also he stated his depression is at 4 and hopelessness.she denied any si/hi/av.  RN will monitor and Q 15 min ck's continue.

## 2012-04-22 NOTE — Progress Notes (Signed)
Pt attended AA group this evening.  

## 2012-04-23 NOTE — Progress Notes (Signed)
Patient did attend the evening speaker AA meeting.  

## 2012-04-23 NOTE — Progress Notes (Signed)
D   Pt has been in bed most of the day she complains of being very sleepy and queasy   Pt did not attends group this morning   She denies suicidal and homicidal ideation   A   Verbal support given  Medications administered and effectiveness   Q 15 min checks

## 2012-04-23 NOTE — [facility] Group Notes (Signed)
[facility] Group Notes: (Clinical Social Work)   04/23/2012      Type of Therapy:  Group Therapy   Participation Level:  Did Not Attend    Ambrose Mantle, LCSW 04/23/2012, 10:42 AM

## 2012-04-23 NOTE — Progress Notes (Signed)
Patient ID: Alyssa Hansen, female   DOB: [birth year], 22 y.o.   MRN: [id number] [facility] MD Progress Note  04/23/2012 11:17 AM Alyssa Hansen  MRN:  [id number]  Subjective: reports better sleep and mood now.  Still feels her sleep is poor at night. Was sleeping until 10;30 AM this morning. Believed that Subutex is helping her cravings.   Diagnosis:  Opioid Dependence/withdrawal, Cocaine Abuse. Anxiety Disorder NOS  ADL's:  Intact  Sleep: Fair  Appetite:  Fair  Suicidal Ideation:  Plan:  denies Intent:  denies Means:  denies Homicidal Ideation:  Plan:  denies Intent:  denies Means:  denies AEB (as evidenced by):  Psychiatric Specialty Exam: Review of Systems  Constitutional: Negative.   HENT: Negative.   Eyes: Negative.   Respiratory: Negative.   Cardiovascular: Negative.   Gastrointestinal: Positive for nausea.  Genitourinary: Negative.   Musculoskeletal: Negative.   Skin: Negative.   Neurological: Negative.   Endo/Heme/Allergies: Negative.   Psychiatric/Behavioral: Positive for substance abuse. The patient is nervous/anxious.     Blood pressure 135/72, pulse 107, temperature 97 F (36.1 C), temperature source Oral, resp. rate 22, height 5\' 5"  (1.651 m), weight 129.729 kg (286 lb), last menstrual period 11/19/2011.Body mass index is 47.59 kg/(m^2).  General Appearance: Fairly Groomed  Patent attorney::  Fair  Speech:  Clear and Coherent  Volume:  Normal  Mood:  Anxious and worried  Affect:  anxious  Thought Process:  Coherent and Goal Directed  Orientation:  Full (Time, Place, and Person)  Thought Content:  worries, concerns  Suicidal Thoughts:  No  Homicidal Thoughts:  No  Memory:  Immediate;   Fair Recent;   Fair Remote;   Fair  Judgement:  Fair  Insight:  Present  Psychomotor Activity:  Restlessness  Concentration:  Fair  Recall:  Fair  Akathisia:  No  Handed:  Right  AIMS (if indicated):     Assets:  Desire for Improvement  Sleep:  Number of Hours: 5.25    Current Medications: Current Facility-Administered Medications  Medication Dose Route Frequency Provider Last Rate Last Dose  . acetaminophen (TYLENOL) tablet 650 mg  650 mg Oral Q6H PRN Rachael Fee, MD   650 mg at 04/16/12 0934  . alum & mag hydroxide-simeth (MAALOX/MYLANTA) 200-200-20 MG/5ML suspension 30 mL  30 mL Oral Q4H PRN Rachael Fee, MD   30 mL at 04/20/12 2200  . buprenorphine (SUBUTEX) SL tablet 4 mg  4 mg Sublingual [id number] Rachael Fee, MD   4 mg at 04/23/12 1010  . magnesium hydroxide (MILK OF MAGNESIA) suspension 30 mL  30 mL Oral Daily PRN Rachael Fee, MD   30 mL at 04/19/12 2148  . ondansetron (ZOFRAN-ODT) disintegrating tablet 4 mg  4 mg Oral Q6H PRN Rachael Fee, MD   4 mg at 04/22/12 1214  . phenylephrine-shark liver oil-mineral oil-petrolatum (PREPARATION H) rectal ointment   Rectal BID PRN Rachael Fee, MD      . prenatal multivitamin tablet 1 tablet  1 tablet Oral Q1200 Rachael Fee, MD   1 tablet at 04/23/12 1010    Lab Results: No results found for this or any previous visit (from the past 48 hour(s)).  Physical Findings: AIMS: Facial and Oral Movements Muscles of Facial Expression: None, normal Lips and Perioral Area: None, normal Jaw: None, normal Tongue: None, normal,Extremity Movements Upper (arms, wrists, hands, fingers): None, normal Lower (legs, knees, ankles, toes): None, normal, Trunk Movements Neck, shoulders, hips: None, normal,  Overall Severity Incapacitation due to abnormal movements: None, normal Patient's awareness of abnormal movements (rate only patient's report): No Awareness, Dental Status Current problems with teeth and/or dentures?: No Does patient usually wear dentures?: No  CIWA:  CIWA-Ar Total: 1 COWS:  COWS Total Score: 4  Treatment Plan Summary: Daily contact with patient to assess and evaluate symptoms and progress in treatment Medication management  Plan:   Supportive approach/coping skills/relapse  prevention.   Will encourage to follow good sleep hyeiegne  Medical Decision Making Problem Points:  Review of last therapy session (1) and Review of psycho-social stressors (1) Data Points:  Review of medication regiment & side effects (2)  I certify that inpatient services furnished can reasonably be expected to improve the patient's condition.   Wonda Cerise 04/23/2012, 11:17 AM

## 2012-04-24 ENCOUNTER — Encounter (HOSPITAL_COMMUNITY): Payer: Self-pay | Admitting: *Deleted

## 2012-04-24 MED ORDER — PRENATAL MULTIVITAMIN CH: 1.0000 | ORAL_TABLET | Freq: Every day | ORAL | Status: DC

## 2012-04-24 MED ORDER — BUPRENORPHINE HCL 2 MG SL SUBL: 4.0000 mg | SUBLINGUAL_TABLET | SUBLINGUAL | Status: DC

## 2012-04-24 NOTE — Progress Notes (Signed)
Adult Psychoeducational Group Note  Date:  04/24/2012 Time:  1:31 PM  Group Topic/Focus:  Wellness Toolbox:   The focus of this group is to discuss various aspects of wellness, balancing those aspects and exploring ways to increase the ability to experience wellness.  Patients will create a wellness toolbox for use upon discharge.  Participation Level:  Active  Participation Quality:  Appropriate, Attentive and Sharing  Affect:  Appropriate  Cognitive:  Appropriate  Insight: Appropriate  Engagement in Group:  Engaged  Modes of Intervention:  Discussion  Additional Comments:Pt was appropriate and sharing while attending group. Pt shared that she sees wellness as remaining clean. Pt stated that she plans to continue to work on herself and to make better decisions. Pt became upset toward the end of group because of a conflict between her and another pt. Pt redirected and asked to deal with the matter in a mature way.   Sharyn Lull 04/24/2012, 1:31 PM

## 2012-04-24 NOTE — [facility] Suicide Risk Assessment (Signed)
Suicide Risk Assessment  Discharge Assessment     Demographic Factors:  Adolescent or young adult, Caucasian and Unemployed  Mental Status Per Nursing Assessment::   On Admission:  NA  Current Mental Status by Physician: In full contact with reality. There are no suicidal ideas, plans or intent. Her mood is worried, affect is appropriate. She is going to pursue Subutex maintenance treatment. She is going to pursue outpatient therapy. She endorses she is committed to her baby.   Loss Factors: NA  Historical Factors: NA  Risk Reduction Factors:   Pregnancy, Living with another person, especially a relative and Positive social support  Continued Clinical Symptoms:  Depression:   Comorbid alcohol abuse/dependence Alcohol/Substance Abuse/Dependencies  Cognitive Features That Contribute To Risk:  Closed-mindedness Thought constriction (tunnel vision)    Suicide Risk:  Minimal: No identifiable suicidal ideation.  Patients presenting with no risk factors but with morbid ruminations; may be classified as minimal risk based on the severity of the depressive symptoms  Discharge Diagnoses:   AXIS I:  Opioid Dependence, Cocaine abuse, substance induced mood disorder AXIS II:  Deferred AXIS III:   Past Medical History  Diagnosis Date  . Scoliosis   . Restless legs   . Bipolar 1 disorder, depressed   . Anxiety   . Depression   . ADHD (attention deficit hyperactivity disorder)   . ADHD (attention deficit hyperactivity disorder)    AXIS IV:  other psychosocial or environmental problems AXIS V:  61-70 mild symptoms  Plan Of Care/Follow-up recommendations:  Activity:  as tolerated Diet:  regular Follow up: Subutex maintenance clinic/[facility] Is patient on multiple antipsychotic therapies at discharge:  No   Has Patient had three or more failed trials of antipsychotic monotherapy by history:  No  Recommended Plan for Multiple Antipsychotic Therapies: N/A   Alyssa Hansen  A 04/24/2012, 12:51 PM

## 2012-04-24 NOTE — Progress Notes (Signed)
Spoke with pt 1:1. Affect is flat, mood neutral. Denies pain or problems at this time. Denies withdrawal. Requesting information on subutex which was printed for her and reviewed. She is denying SI/HI/AVH and remains safe. Lawrence Marseilles

## 2012-04-24 NOTE — Discharge Summary (Signed)
Physician Discharge Summary Note  Patient:  Alyssa Hansen is an 22 y.o., female MRN:  [id number] DOB:  01/19/1990 Patient phone:  [phone] (home)  Patient address:   No address on file.,   Date of Admission:  04/15/2012 Date of Discharge: 04/24/12  Reason for Admission:    Discharge Diagnoses: Active Problems:   Opioid dependence   Cocaine abuse   Opioid use with withdrawal   Substance induced mood disorder  Review of Systems  Constitutional: Negative.   HENT: Negative.   Eyes: Negative.   Respiratory: Negative.   Cardiovascular: Negative.   Gastrointestinal: Negative.   Genitourinary: Negative.   Musculoskeletal: Negative.   Skin: Negative.   Neurological: Negative.   Endo/Heme/Allergies: Negative.   Psychiatric/Behavioral: Positive for substance abuse (Opioid dependence). Negative for depression, suicidal ideas, hallucinations and memory loss. The patient is not nervous/anxious and does not have insomnia.    Axis Diagnosis:   AXIS I:  Substance Induced Mood Disorder and Opioid dependence, Cocaine dependence AXIS II:  Deferred AXIS III:   Past Medical History  Diagnosis Date  . Scoliosis   . Restless legs   . Bipolar 1 disorder, depressed   . Anxiety   . Depression   . ADHD (attention deficit hyperactivity disorder)   . ADHD (attention deficit hyperactivity disorder)    AXIS IV:  Subsatnce abuse issues AXIS V:  1-10 persistent dangerousness to self and others present  Level of Care:  OP  Hospital Course:  [redacted] weeks pregnant. Was throwing up a lot. She is using OxyContin. She uses between 60 and 200 mg per day. Gaylyn Rong been in and out of rehab last five years. Last rehab got out in December. Abstinent for 4 months. Just at home, gets bored, goes back to using. Has a prescription 75 Oxy's 15. Has scoliosis of her back. She also buys from the street. Experiences withdrawal, sweats, diarrhea, nausea, vomiting. Has been using everyday for last 6 weeks.  Upon admission  into this hospital and after admission assessment/evaluation including UDS/toxicology reports, it was determined that patient will need detoxification treatment protocol to stabilize her system of opioid intoxication and to combat the withdrawal symptoms as well. It was also agreed by patient and his provider that she is not a candidate for Clonidine detoxification treatment protocol because she is pregnant. This is detemined by the probable fact that librium treatment protocol may pose a risk to the unborn child. And in other to not allow patient to go into withdrawal symptoms of opiates while pregnant, she was started on Subutex sublingual tablets.  It is believed that the dependency on opiate drugs is associated with the harsh withdrawal symptoms felt by the patient when coming off of this drug. And because the withdrawal symptoms are uncomfortable and most times unbearable for [facility], she has to almost use to feel normal again despite the fact that she is pregnant,   And because Ms. Rivers is unable to control the urge to use opioid drugs, it was determined that she will need a supervised long term treatment with Subutex (Buprenorphine) to quench the urge to use. This Subutex treatment regimen was initiated while in this hospital and will continue after discharge under the care of Dr. Claudine Mouton. Patient is made aware that while on this treatment regimen, she will be required to do a random drug testing periodically to assure that she is not using any other illegal substances and or any controlled substance not prescribed by a licensed clinician.  As to  what patient has learned from being in this hospital. Jaanai states that she learned to accept the facts that she is an opioid addict, including other illegal substances. And that she has actually not been able to stop using even this time that she is pregnant. She is realizing now that this is a huge problems for her and worse, it poses a very dangerous risks to  her unborn child.  She will follow-up care at the [facility] here in [place] with Dr. Claudine Mouton. She is instructed to call Dr. Claudine Mouton for an appointment after discharge from this hospital. The address, date, time and contact information for the [facility] provided for patient in writing. Upon discharge, patient adamantly denies any suicidal, homicidal ideations, auditory, visual hallucinations, paranoia, delusional thoughts and or withdrawal symptoms. She left [facility] with all personal belongings in no apparent distress. Transportation per family.  Consults:  OBGYN, Psychiatry  Significant Diagnostic Studies:  labs: CBC with diff, CMP, UDS, Toxicology tetss, U/A, Pregnancy tests  Discharge Vitals:   Blood pressure 144/81, pulse 112, temperature 97.6 F (36.4 C), temperature source Oral, resp. rate 20, height 5\' 5"  (1.651 m), weight 129.729 kg (286 lb), last menstrual period 11/19/2011. Body mass index is 47.59 kg/(m^2). Lab Results:   No results found for this or any previous visit (from the past 72 hour(s)).  Physical Findings: AIMS: Facial and Oral Movements Muscles of Facial Expression: None, normal Lips and Perioral Area: None, normal Jaw: None, normal Tongue: None, normal,Extremity Movements Upper (arms, wrists, hands, fingers): None, normal Lower (legs, knees, ankles, toes): None, normal, Trunk Movements Neck, shoulders, hips: None, normal, Overall Severity Incapacitation due to abnormal movements: None, normal Patient's awareness of abnormal movements (rate only patient's report): No Awareness, Dental Status Current problems with teeth and/or dentures?: No Does patient usually wear dentures?: No  CIWA:  CIWA-Ar Total: 1 COWS:  COWS Total Score: 4  Psychiatric Specialty Exam: See Psychiatric Specialty Exam and Suicide Risk Assessment completed by Attending Physician prior to discharge.  Discharge destination:  Home  Is patient on multiple  antipsychotic therapies at discharge:  No   Has Patient had three or more failed trials of antipsychotic monotherapy by history:  No  Recommended Plan for Multiple Antipsychotic Therapies: NA       Future Appointments Provider Department Dept Phone   05/16/2012 3:15 PM [facility] [place] [facility] [phone]       Medication List    STOP taking these medications       buPROPion 100 MG tablet  Commonly known as:  WELLBUTRIN     FLUoxetine 40 MG capsule  Commonly known as:  PROZAC     gabapentin 600 MG tablet  Commonly known as:  NEURONTIN     hydrOXYzine 25 MG tablet  Commonly known as:  ATARAX/VISTARIL     lisdexamfetamine 60 MG capsule  Commonly known as:  VYVANSE     oxyCODONE 15 MG immediate release tablet  Commonly known as:  ROXICODONE     zolpidem 10 MG tablet  Commonly known as:  AMBIEN      TAKE these medications     Indication   buprenorphine 2 MG Subl  Commonly known as:  SUBUTEX  Place 2 tablets (4 mg total) under the tongue 3 (three) times daily at 8am, 2pm and bedtime. :(See Md's hand written prescription)   Indication:  Opioid Dependence     prenatal multivitamin Tabs  Take 1 tablet by mouth  daily at 12 noon. (May be purchased from over the counter):For prenatal care   Indication:  Pregnancy       Follow-up Information   Follow up with Dr Thedore Mins.   Contact information:   [facility] [address]  [address] [place], [place] [address]  Call Dr. Lorenda Ishihara [place]o [phone]       Follow-up recommendations:   Activity:  As tolerated Diet: As recommended by your primary care doctor. Keep all scheduled follow-up appointments as recommended. Comply with the Subutex clinic requirements/continue to work the relapse prevention plan Comments: Take all your medications as prescribed by your mental healthcare provider. Report any adverse effects and or reactions from your  medicines to your outpatient provider promptly. Patient is instructed and cautioned to not engage in alcohol and or illegal drug use while on prescription medicines. In the event of worsening symptoms, patient is instructed to call the crisis hotline, 911 and or go to the nearest ED for appropriate evaluation and treatment of symptoms. Follow-up with your primary care provider for your other medical issues, concerns and or health care needs.    Total Discharge Time:  Greater than 30 minutes.  SignedArmandina Stammer I 04/24/2012, 11:07 AM

## 2012-04-24 NOTE — Progress Notes (Signed)
[facility] Adult Case Management Discharge Plan :  Will you be returning to the same living situation after discharge: Yes,  with family At discharge, do you have transportation home?:Yes,  grandparents Do you have the ability to pay for your medications: Yes with Medicaid  Release of information consent forms completed and in the chart;  Patient's signature needed at discharge.  Patient to Follow up at: Follow-up Information   Follow up with Dr Thedore Mins On 05/19/2012. (You will see Dr Jannifer Franklin on May 16th at 2:10 PM)    Contact information:   [facility]  [address]  [address] [place], [place] [address] [facility] [phone]   FAX [phone]      Follow up with Gustavo Lah . (Call Caren Griffins for new appointment at [phone])    Contact information:   [address] [place], [place]  [facility] [phone] FAX [phone]      Patient denies SI/HI:   Yes,  denies both    Safety Planning and Suicide Prevention discussed:  No. Patient neither endorsed suicidal ideation at admit or during hospitalization.  Clide Dales 04/24/2012, 3:17 PM

## 2012-04-24 NOTE — Progress Notes (Signed)
Pt was discharged home today.  She denied any S/I H/I or A/V hallucinations.    She was given f/u appointment, rx for her subutex,  and hotline info booklet.  She voiced understanding to all instructions provided.  She declined the need for smoking cessation materials.

## 2012-04-27 NOTE — Progress Notes (Addendum)
Patient Discharge Instructions:  After Visit Summary (AVS):   Faxed to:  04/27/12 Discharge Summary Note:   Faxed to:  04/27/12 Psychiatric Admission Assessment Note:   Faxed to:  04/27/12 Suicide Risk Assessment - Discharge Assessment:   Faxed to:  04/27/12 Faxed/Sent to the Next Level Care provider:  04/27/12 Faxed to [facility] - Dr Jannifer Franklin @ [phone] Faxed to Caren Griffins @ [phone]  Jerelene Redden, 04/27/2012, 2:45 PM

## 2012-05-11 ENCOUNTER — Other Ambulatory Visit (HOSPITAL_COMMUNITY): Payer: Self-pay | Admitting: Obstetrics and Gynecology

## 2012-05-11 DIAGNOSIS — E0590 Thyrotoxicosis, unspecified without thyrotoxic crisis or storm: Secondary | ICD-10-CM

## 2012-05-16 ENCOUNTER — Ambulatory Visit (HOSPITAL_COMMUNITY)
Admit: 2012-05-16 | Discharge: 2012-05-16 | Disposition: A | Discharge status: Home / Self Care | Payer: BC Managed Care – PPO | Attending: Obstetrics and Gynecology | Admitting: Obstetrics and Gynecology

## 2012-05-16 VITALS — BP 122/65 | HR 104 | Wt 279.8 lb

## 2012-05-16 DIAGNOSIS — Z3689 Encounter for other specified antenatal screening: Secondary | ICD-10-CM | POA: Insufficient documentation

## 2012-05-16 DIAGNOSIS — E079 Disorder of thyroid, unspecified: Secondary | ICD-10-CM | POA: Insufficient documentation

## 2012-05-16 DIAGNOSIS — F1410 Cocaine abuse, uncomplicated: Secondary | ICD-10-CM

## 2012-05-16 DIAGNOSIS — E0590 Thyrotoxicosis, unspecified without thyrotoxic crisis or storm: Secondary | ICD-10-CM

## 2012-05-16 DIAGNOSIS — F1910 Other psychoactive substance abuse, uncomplicated: Secondary | ICD-10-CM | POA: Insufficient documentation

## 2012-05-16 DIAGNOSIS — O34219 Maternal care for unspecified type scar from previous cesarean delivery: Secondary | ICD-10-CM | POA: Insufficient documentation

## 2012-05-16 DIAGNOSIS — E039 Hypothyroidism, unspecified: Secondary | ICD-10-CM | POA: Insufficient documentation

## 2012-05-16 DIAGNOSIS — O09899 Supervision of other high risk pregnancies, unspecified trimester: Secondary | ICD-10-CM | POA: Insufficient documentation

## 2012-05-16 DIAGNOSIS — F1120 Opioid dependence, uncomplicated: Secondary | ICD-10-CM

## 2012-05-16 DIAGNOSIS — E669 Obesity, unspecified: Secondary | ICD-10-CM | POA: Insufficient documentation

## 2012-05-16 DIAGNOSIS — O99330 Smoking (tobacco) complicating pregnancy, unspecified trimester: Secondary | ICD-10-CM | POA: Insufficient documentation

## 2012-05-16 DIAGNOSIS — F1920 Other psychoactive substance dependence, uncomplicated: Secondary | ICD-10-CM | POA: Insufficient documentation

## 2012-05-16 DIAGNOSIS — O99340 Other mental disorders complicating pregnancy, unspecified trimester: Secondary | ICD-10-CM | POA: Insufficient documentation

## 2012-05-16 NOTE — Progress Notes (Signed)
Alyssa Hansen  was seen today for an ultrasound appointment.  See full report in [facility].  Impression: IUP at 22+5 weeks  Normal fetal anatomic survey; however, somewhat limited views of the fetal heart again obtained (ductus, aortic arch) due to fetal position and maternal body habitus Interval growth is appropriate (54th %tile) Normal amniotic fluid volume  Recommendations: Recommend follow-up ultrasound examination in 4 weeks for interval growth and anatomy.  Alpha Gula, MD

## 2012-05-22 ENCOUNTER — Inpatient Hospital Stay (HOSPITAL_COMMUNITY)
Admission: AD | Admit: 2012-05-22 | Discharge: 2012-05-22 | Disposition: A | Discharge status: Home / Self Care | Payer: BC Managed Care – PPO | Source: Ambulatory Visit | Attending: Obstetrics and Gynecology | Admitting: Obstetrics and Gynecology

## 2012-05-22 ENCOUNTER — Encounter (HOSPITAL_COMMUNITY): Payer: Self-pay | Admitting: *Deleted

## 2012-05-22 DIAGNOSIS — O4700 False labor before 37 completed weeks of gestation, unspecified trimester: Secondary | ICD-10-CM | POA: Insufficient documentation

## 2012-05-22 DIAGNOSIS — O9989 Other specified diseases and conditions complicating pregnancy, childbirth and the puerperium: Secondary | ICD-10-CM

## 2012-05-22 DIAGNOSIS — R109 Unspecified abdominal pain: Secondary | ICD-10-CM | POA: Insufficient documentation

## 2012-05-22 DIAGNOSIS — O99891 Other specified diseases and conditions complicating pregnancy: Secondary | ICD-10-CM | POA: Insufficient documentation

## 2012-05-22 DIAGNOSIS — N949 Unspecified condition associated with female genital organs and menstrual cycle: Secondary | ICD-10-CM | POA: Insufficient documentation

## 2012-05-22 DIAGNOSIS — R102 Pelvic and perineal pain: Secondary | ICD-10-CM

## 2012-05-22 HISTORY — DX: Hypothyroidism, unspecified: E03.9

## 2012-05-22 HISTORY — DX: Other specified personal risk factors, not elsewhere classified: Z91.89

## 2012-05-22 HISTORY — DX: Polycystic ovarian syndrome: E28.2

## 2012-05-22 HISTORY — DX: Herpesviral infection, unspecified: B00.9

## 2012-05-22 HISTORY — DX: Other psychoactive substance abuse, uncomplicated: F19.10

## 2012-05-22 HISTORY — DX: Obesity, unspecified: E66.9

## 2012-05-22 HISTORY — DX: Chlamydial infection, unspecified: A74.9

## 2012-05-22 HISTORY — DX: Trichomoniasis, unspecified: A59.9

## 2012-05-22 LAB — URINALYSIS, ROUTINE W REFLEX MICROSCOPIC
Hgb urine dipstick: NEGATIVE
Nitrite: NEGATIVE
Specific Gravity, Urine: 1.025 (ref 1.005–1.030)
Urobilinogen, UA: 0.2 mg/dL (ref 0.0–1.0)

## 2012-05-22 LAB — FETAL FIBRONECTIN: Fetal Fibronectin: NEGATIVE

## 2012-05-22 LAB — URINE MICROSCOPIC-ADD ON

## 2012-05-22 LAB — WET PREP, GENITAL: Yeast Wet Prep HPF POC: NONE SEEN

## 2012-05-22 NOTE — MAU Note (Addendum)
C/O squeezing type pain in lower abdomen and into vagina since 0900. Has been off and on, but today the worst. Felt it 2 times yesterday. Noted blood when she wiped and in the urine. Vaginal discharge, clear with a little bit of blood around 1045. States panties were "soaking wet." States has not felt the baby move for 2 days. No blood or fluid noted on arrival to hospital.

## 2012-05-22 NOTE — MAU Provider Note (Signed)
History     CSN: [id number]  Arrival date and time: 05/22/12 1320   First Provider Initiated Contact with Patient 05/22/12 1422      Chief Complaint  Patient presents with  . Abdominal Pain   HPI  Alyssa Hansen is a 22 y.o. G2P1001 at [redacted]w[redacted]d who presents today with vaginal pain, and "sharp pains on both side of my stomach". She is also having some vaginal bleeding. She states that it is just there when she wipes. She is also having some leaking of "watery discharge". She has not needed to wear a pad, but states that her underwear were wet once today. She reports a lot of vaginal discharge with no odor, but it is itchy.   Past Medical History  Diagnosis Date  . Scoliosis   . Restless legs   . Bipolar 1 disorder, depressed   . Anxiety   . Depression   . ADHD (attention deficit hyperactivity disorder)   . ADHD (attention deficit hyperactivity disorder)   . Obesity   . Substance abuse   . ADHD (attention deficit hyperactivity disorder)   . History of suicidal tendencies   . Herpes     + blood test, never had outbreak  . Chlamydia   . Hypothyroidism     No meds.  Marland Kitchen PCOS (polycystic ovarian syndrome)   . Trichomonas     Past Surgical History  Procedure Laterality Date  . Cesarean section    . Laparoscopic abdominal exploration      Family History  Problem Relation Age of Onset  . Alcohol abuse Neg Hx   . Arthritis Neg Hx   . Asthma Neg Hx   . Birth defects Neg Hx   . Cancer Neg Hx   . Depression Neg Hx   . COPD Neg Hx   . Drug abuse Neg Hx   . Early death Neg Hx   . Diabetes Neg Hx   . Hearing loss Neg Hx     History  Substance Use Topics  . Smoking status: Current Every Day Smoker -- 1.00 packs/day    Types: Cigarettes  . Smokeless tobacco: Never Used  . Alcohol Use: No    Allergies:  Allergies  Allergen Reactions  . Amoxicillin Swelling  . Cefaclor Swelling and Rash    No prescriptions prior to admission    Review of Systems  Constitutional:  Negative for fever and chills.  Eyes: Negative for blurred vision.  Respiratory: Negative for shortness of breath.   Cardiovascular: Negative for chest pain.  Gastrointestinal: Positive for nausea and vomiting (reports 3-4 x per day emesis ). Negative for diarrhea and constipation.  Genitourinary: Positive for dysuria. Negative for urgency and frequency.  Musculoskeletal: Negative for myalgias.  Neurological: Negative for dizziness and headaches.   Physical Exam   Blood pressure 115/69, pulse 102, temperature 98.3 F (36.8 C), temperature source Oral, resp. rate 18, last menstrual period 11/19/2011, SpO2 97.00%.  Physical Exam  Nursing note and vitals reviewed. Constitutional: She is oriented to person, place, and time. She appears well-developed and well-nourished. No distress.  Cardiovascular: Normal rate.   Respiratory: Effort normal.  GI: Soft. She exhibits no distension.  Genitourinary:   External: no lesion Vagina: scant amount of white discharge seen, no pooling of fluid, no blood seen Cervix: no fluid seen from os, closed/thick/high Uterus: AGA   Neurological: She is alert and oriented to person, place, and time.  Skin: Skin is warm and dry.  Psychiatric: She has a  normal mood and affect.    MAU Course  Procedures  Results for orders placed during the hospital encounter of 05/22/12 (from the past 24 hour(s))  URINALYSIS, ROUTINE W REFLEX MICROSCOPIC     Status: Abnormal   Collection Time    05/22/12  1:37 PM      Result Value Range   Color, Urine YELLOW  YELLOW   APPearance CLOUDY (*) CLEAR   Specific Gravity, Urine 1.025  1.005 - 1.030   pH 7.0  5.0 - 8.0   Glucose, UA NEGATIVE  NEGATIVE mg/dL   Hgb urine dipstick NEGATIVE  NEGATIVE   Bilirubin Urine NEGATIVE  NEGATIVE   Ketones, ur NEGATIVE  NEGATIVE mg/dL   Protein, ur 30 (*) NEGATIVE mg/dL   Urobilinogen, UA 0.2  0.0 - 1.0 mg/dL   Nitrite NEGATIVE  NEGATIVE   Leukocytes, UA NEGATIVE  NEGATIVE  URINE  MICROSCOPIC-ADD ON     Status: Abnormal   Collection Time    05/22/12  1:37 PM      Result Value Range   Squamous Epithelial / LPF MANY (*) RARE   Bacteria, UA FEW (*) RARE   Crystals CA OXALATE CRYSTALS (*) NEGATIVE   Urine-Other MUCOUS PRESENT    WET PREP, GENITAL     Status: Abnormal   Collection Time    05/22/12  2:32 PM      Result Value Range   Yeast Wet Prep HPF POC NONE SEEN  NONE SEEN   Trich, Wet Prep NONE SEEN  NONE SEEN   Clue Cells Wet Prep HPF POC FEW (*) NONE SEEN   WBC, Wet Prep HPF POC FEW (*) NONE SEEN  FETAL FIBRONECTIN     Status: None   Collection Time    05/22/12  2:32 PM      Result Value Range   Fetal Fibronectin NEGATIVE  NEGATIVE    1458: Spoke with Dr.Henley, send FFN, but patient my go home regardless at this time.  Assessment and Plan   1. Pain of round ligament complicating pregnancy, antepartum    FU as scheduled PTL danger signs reviewed  Tawnya Crook 05/22/2012, 6:18 PM

## 2012-05-22 NOTE — MAU Note (Signed)
Patient states she is still taking oxycodone for pain. Patient dozing off during interview.

## 2012-05-25 ENCOUNTER — Encounter (HOSPITAL_COMMUNITY): Payer: Self-pay | Admitting: *Deleted

## 2012-05-25 ENCOUNTER — Emergency Department (HOSPITAL_COMMUNITY)
Admission: EM | Admit: 2012-05-25 | Discharge: 2012-05-26 | Disposition: A | Discharge status: Home / Self Care | Payer: BC Managed Care – PPO | Attending: Emergency Medicine | Admitting: Emergency Medicine

## 2012-05-25 ENCOUNTER — Inpatient Hospital Stay (HOSPITAL_COMMUNITY)
Admission: AD | Admit: 2012-05-25 | Discharge: 2012-05-25 | Disposition: A | Discharge status: Home / Self Care | Payer: BC Managed Care – PPO | Source: Ambulatory Visit | Attending: Obstetrics and Gynecology | Admitting: Obstetrics and Gynecology

## 2012-05-25 DIAGNOSIS — Z3490 Encounter for supervision of normal pregnancy, unspecified, unspecified trimester: Secondary | ICD-10-CM

## 2012-05-25 DIAGNOSIS — F1920 Other psychoactive substance dependence, uncomplicated: Secondary | ICD-10-CM | POA: Insufficient documentation

## 2012-05-25 DIAGNOSIS — O99210 Obesity complicating pregnancy, unspecified trimester: Secondary | ICD-10-CM | POA: Insufficient documentation

## 2012-05-25 DIAGNOSIS — E669 Obesity, unspecified: Secondary | ICD-10-CM | POA: Insufficient documentation

## 2012-05-25 DIAGNOSIS — Z880 Allergy status to penicillin: Secondary | ICD-10-CM | POA: Insufficient documentation

## 2012-05-25 DIAGNOSIS — O99320 Drug use complicating pregnancy, unspecified trimester: Secondary | ICD-10-CM | POA: Insufficient documentation

## 2012-05-25 DIAGNOSIS — F1410 Cocaine abuse, uncomplicated: Secondary | ICD-10-CM

## 2012-05-25 DIAGNOSIS — E039 Hypothyroidism, unspecified: Secondary | ICD-10-CM | POA: Insufficient documentation

## 2012-05-25 DIAGNOSIS — F909 Attention-deficit hyperactivity disorder, unspecified type: Secondary | ICD-10-CM | POA: Insufficient documentation

## 2012-05-25 DIAGNOSIS — F1120 Opioid dependence, uncomplicated: Secondary | ICD-10-CM | POA: Insufficient documentation

## 2012-05-25 DIAGNOSIS — O99340 Other mental disorders complicating pregnancy, unspecified trimester: Secondary | ICD-10-CM | POA: Insufficient documentation

## 2012-05-25 DIAGNOSIS — Z8619 Personal history of other infectious and parasitic diseases: Secondary | ICD-10-CM | POA: Insufficient documentation

## 2012-05-25 DIAGNOSIS — E079 Disorder of thyroid, unspecified: Secondary | ICD-10-CM | POA: Insufficient documentation

## 2012-05-25 DIAGNOSIS — F319 Bipolar disorder, unspecified: Secondary | ICD-10-CM | POA: Insufficient documentation

## 2012-05-25 DIAGNOSIS — Z8739 Personal history of other diseases of the musculoskeletal system and connective tissue: Secondary | ICD-10-CM | POA: Insufficient documentation

## 2012-05-25 DIAGNOSIS — Z8669 Personal history of other diseases of the nervous system and sense organs: Secondary | ICD-10-CM | POA: Insufficient documentation

## 2012-05-25 HISTORY — DX: Other psychoactive substance abuse, uncomplicated: F19.10

## 2012-05-25 LAB — COMPREHENSIVE METABOLIC PANEL
ALT: 15 U/L (ref 0–35)
Alkaline Phosphatase: 72 U/L (ref 39–117)
CO2: 28 mEq/L (ref 19–32)
Chloride: 100 mEq/L (ref 96–112)
GFR calc Af Amer: >90 mL/min (ref >90)
GFR calc non Af Amer: >90 mL/min (ref >90)
Glucose, Bld: 74 mg/dL (ref 70–99)
Potassium: 3.3 mEq/L — ABNORMAL LOW (ref 3.5–5.1)
Sodium: 138 mEq/L (ref 135–145)

## 2012-05-25 LAB — ACETAMINOPHEN LEVEL: Acetaminophen (Tylenol), Serum: <15 ug/mL (ref 10–30)

## 2012-05-25 LAB — CBC
Hemoglobin: 10.6 g/dL — ABNORMAL LOW (ref 12.0–15.0)
RBC: 4.04 MIL/uL (ref 3.87–5.11)
WBC: 12.7 10*3/uL — ABNORMAL HIGH (ref 4.0–10.5)

## 2012-05-25 MED ORDER — ACETAMINOPHEN 325 MG PO TABS: 650.0000 mg | ORAL_TABLET | ORAL | Status: DC | PRN

## 2012-05-25 MED ORDER — NICOTINE 21 MG/24HR TD PT24: 21.0000 mg | MEDICATED_PATCH | Freq: Every day | TRANSDERMAL | Status: DC

## 2012-05-25 MED ORDER — ALUM & MAG HYDROXIDE-SIMETH 200-200-20 MG/5ML PO SUSP: 30.0000 mL | ORAL | Status: DC | PRN

## 2012-05-25 MED ORDER — ONDANSETRON HCL 4 MG PO TABS: 4.0000 mg | ORAL_TABLET | Freq: Three times a day (TID) | ORAL | Status: DC | PRN

## 2012-05-25 NOTE — ED Notes (Signed)
Pt transferred from triage, presents for medical clearance, detox from oxycodone, last used at 6:30pm today.  Reports feeling hopeless.  Denies SI or HI, no AV hallucinations.  Admits to diag: Depression, Anxiety, Bipolar, ADHD.  Pt calm & cooperative.

## 2012-05-25 NOTE — ED Provider Notes (Signed)
History    This chart was scribed for Junious Silk, PA working with Vida Roller, MD by ED Scribe, Burman Nieves. This patient was seen in room WTR4/WLPT4 and the patient's care was started at 8:35 PM.   CSN: [id number]  Arrival date & time 05/25/12  2022   First MD Initiated Contact with Patient 05/25/12 2035      Chief Complaint  Patient presents with  . Medical Clearance    (Consider location/radiation/quality/duration/timing/severity/associated sxs/prior treatment) HPI HPI Comments: Alyssa Hansen is a 22 y.o. female with h/o depression and obesity who presents to the Emergency Department requesting detox from oxycodone on and off for the 8-9 years and occasional cocaine use. Pt states that she is [redacted] weeks pregnant. Pt is currently calm and cooperative in the ED. Pt states that three weeks ago she started using oxycodone again due to injuring her ankle (ankle injury has resolved). Pt states she uses between 60-300 mg a day. Pt has tried detox before at behavioral health but relapsed shortly after. Pt's last use was today and states that she had 90 mg of oxycodone. [facility] and results showed that her baby is okay. Pt denies SI, HI, and hallucinations. Pt also denies fever, chills, cough, nausea, vomiting, diarrhea, SOB, weakness, and any other associated symptoms. Pt denies use of alcohol or any other recreational drug.    Past Medical History  Diagnosis Date  . Scoliosis   . Restless legs   . Bipolar 1 disorder, depressed   . Anxiety   . Depression   . ADHD (attention deficit hyperactivity disorder)   . ADHD (attention deficit hyperactivity disorder)   . Obesity   . Substance abuse   . ADHD (attention deficit hyperactivity disorder)   . History of suicidal tendencies   . Herpes     + blood test, never had outbreak  . Chlamydia   . Hypothyroidism     No meds.  Marland Kitchen PCOS (polycystic ovarian syndrome)   . Trichomonas   . Drug abuse     Past  Surgical History  Procedure Laterality Date  . Cesarean section    . Laparoscopic abdominal exploration      Family History  Problem Relation Age of Onset  . Alcohol abuse Neg Hx   . Arthritis Neg Hx   . Asthma Neg Hx   . Birth defects Neg Hx   . Cancer Neg Hx   . Depression Neg Hx   . COPD Neg Hx   . Drug abuse Neg Hx   . Early death Neg Hx   . Hearing loss Neg Hx   . Hypertension Father   . Hypertension Maternal Grandmother   . Hypertension Maternal Grandfather   . Heart disease Maternal Grandfather   . Diabetes Maternal Grandfather     History  Substance Use Topics  . Smoking status: Current Every Day Smoker -- 1.00 packs/day    Types: Cigarettes  . Smokeless tobacco: Never Used  . Alcohol Use: No    OB History   Grav Para Term Preterm Abortions TAB SAB Ect Mult Living   2 1 1       1       Review of Systems  Constitutional: Negative for fever and chills.  Respiratory: Negative for shortness of breath.   Cardiovascular: Negative for chest pain.  Psychiatric/Behavioral: Negative for suicidal ideas.  All other systems reviewed and are negative.    Allergies  Amoxicillin and Cefaclor  Home Medications   Current Outpatient Rx  Name  Route  Sig  Dispense  Refill  . oxyCODONE (ROXICODONE) 15 MG immediate release tablet   Oral   Take 15-30 mg by mouth every 3 (three) hours as needed for pain.         Marland Kitchen oxycodone (ROXICODONE) 30 MG immediate release tablet   Oral   Take 30 mg by mouth every 3 (three) hours as needed for pain.         Marland Kitchen zolpidem (AMBIEN) 10 MG tablet   Oral   Take 10 mg by mouth at bedtime.           BP 130/72  Pulse 101  Temp(Src) 98.3 F (36.8 C) (Oral)  Resp 16  SpO2 94%  LMP 11/19/2011  Physical Exam  Nursing note and vitals reviewed. Constitutional: She is oriented to person, place, and time. She appears well-developed and well-nourished. No distress.  Pt is calm and cooperative during PE.   HENT:  Head:  Normocephalic and atraumatic.  Right Ear: External ear normal.  Left Ear: External ear normal.  Nose: Nose normal.  Mouth/Throat: Oropharynx is clear and moist.  Eyes: Conjunctivae are normal.  Neck: Normal range of motion.  Cardiovascular: Normal rate, regular rhythm and normal heart sounds.   Pulmonary/Chest: Effort normal and breath sounds normal. No stridor. No respiratory distress. She has no wheezes. She has no rales.  Abdominal: Soft. She exhibits distension. There is no tenderness.  [redacted] weeks pregnant.   Musculoskeletal: Normal range of motion.  Neurological: She is alert and oriented to person, place, and time. She has normal strength.  Skin: Skin is warm and dry. She is not diaphoretic. No erythema.  Psychiatric: She has a normal mood and affect. Her behavior is normal.    ED Course  Procedures (including critical care time) DIAGNOSTIC STUDIES: Oxygen Saturation is 94% on room air, adequate by my interpretation.    COORDINATION OF CARE: 8:40 PM Discussed ED treatment with pt and pt agrees.     Labs Reviewed  CBC - Abnormal; Notable for the following:    WBC 12.7 (*)    Hemoglobin 10.6 (*)    HCT 33.1 (*)    All other components within normal limits  COMPREHENSIVE METABOLIC PANEL - Abnormal; Notable for the following:    Potassium 3.3 (*)    BUN 4 (*)    Albumin 2.4 (*)    All other components within normal limits  SALICYLATE LEVEL - Abnormal; Notable for the following:    Salicylate Lvl <2.0 (*)    All other components within normal limits  ACETAMINOPHEN LEVEL  ETHANOL  URINE RAPID DRUG SCREEN (HOSP PERFORMED)   No results found.   1. Pregnancy   2. Opioid dependence       MDM  Patient presents with request for detox from opioids. She recently began abusing oxycodone again 3 weeks ago. She is [redacted] weeks pregnant. No SI/HI. No alcohol abuse. Denies other drugs except cocaine which she used yesterday "because she was all messed up on oxycodone". She was  cleared for her pregnancy by [facility] earlier today. ACT team was consulted and she was moved to the Psych ED. She is awaiting placement for oxycodone withdrawal. Vital signs stable.     I personally performed the services described in this documentation, which was scribed in my presence. The recorded information has been reviewed and is accurate.     Mora Bellman, PA-C 05/26/12 0045

## 2012-05-25 NOTE — ED Notes (Signed)
Pt in requesting detox from oxycodone, pt is [redacted] weeks pregnant, also occasional cocaine use. Pt denies ETOH or other substance use. Denies SI/HI. Last use today.

## 2012-05-25 NOTE — MAU Note (Signed)
Pt states she had previously detoxed and she injuried herself 05/12/2012 and started taking pain medicaions for the injury again.pt wishes to detox

## 2012-05-25 NOTE — MAU Note (Signed)
Patient states she has been taking Oxycodone since 5-9 after going through detox [facility] in [facility]. States she fell and hurt her ankle and started taking again. Wants to be cleared for readmission to [facility]. Patient denies any problems with the pregnancy.

## 2012-05-25 NOTE — ED Notes (Signed)
Pt admits to using heroin. Last use 630 this morning

## 2012-05-25 NOTE — MAU Note (Signed)
Pt elected to go to [facility] in [place]. Report called by M.ToppRN and Telford Nab CNM

## 2012-05-25 NOTE — MAU Provider Note (Signed)
History     CSN: [id number]  Arrival date and time: 05/25/12 1723   None     Chief Complaint  Patient presents with  . Addiction Problem   HPI 22 y.o. G2P1001 at [redacted]w[redacted]d here requesting detox from oxycodone. Previously went to [facility] for oxycodone abuse and was put on Suboxone. Stopped that medication and started using oxycodone again around 5/9. Injecting daily. Took 45 mg today. Denies pregnancy concerns. No pain or bleeding, + fetal movement. Uncomplicated prenatal course other than drug abuse.   Past Medical History  Diagnosis Date  . Scoliosis   . Restless legs   . Bipolar 1 disorder, depressed   . Anxiety   . Depression   . ADHD (attention deficit hyperactivity disorder)   . ADHD (attention deficit hyperactivity disorder)   . Obesity   . Substance abuse   . ADHD (attention deficit hyperactivity disorder)   . History of suicidal tendencies   . Herpes     + blood test, never had outbreak  . Chlamydia   . Hypothyroidism     No meds.  Marland Kitchen PCOS (polycystic ovarian syndrome)   . Trichomonas   . Drug abuse     Past Surgical History  Procedure Laterality Date  . Cesarean section    . Laparoscopic abdominal exploration      Family History  Problem Relation Age of Onset  . Alcohol abuse Neg Hx   . Arthritis Neg Hx   . Asthma Neg Hx   . Birth defects Neg Hx   . Cancer Neg Hx   . Depression Neg Hx   . COPD Neg Hx   . Drug abuse Neg Hx   . Early death Neg Hx   . Hearing loss Neg Hx   . Hypertension Father   . Hypertension Maternal Grandmother   . Hypertension Maternal Grandfather   . Heart disease Maternal Grandfather   . Diabetes Maternal Grandfather     History  Substance Use Topics  . Smoking status: Current Every Day Smoker -- 1.00 packs/day    Types: Cigarettes  . Smokeless tobacco: Never Used  . Alcohol Use: No    Allergies:  Allergies  Allergen Reactions  . Amoxicillin Swelling  . Cefaclor Swelling and Rash    Prescriptions prior  to admission  Medication Sig Dispense Refill  . ibuprofen (ADVIL,MOTRIN) 200 MG tablet Take 800 mg by mouth every 6 (six) hours as needed for pain or headache.      . oxyCODONE (ROXICODONE) 15 MG immediate release tablet Take 15 mg by mouth every 8 (eight) hours as needed for pain.      . Prenatal Vit-Fe Fumarate-FA (PRENATAL MULTIVITAMIN) TABS Take 1 tablet by mouth daily at 12 noon. (May be purchased from over the counter):For prenatal care  30 tablet  0  . zolpidem (AMBIEN) 10 MG tablet Take 10 mg by mouth at bedtime as needed for sleep.        Review of Systems  Constitutional: Negative.   Respiratory: Negative.   Cardiovascular: Negative.   Gastrointestinal: Negative for nausea, vomiting, abdominal pain, diarrhea and constipation.  Genitourinary: Negative for dysuria, urgency, frequency, hematuria and flank pain.       Negative for vaginal bleeding, cramping/contractions  Musculoskeletal: Negative.   Neurological: Negative.   Psychiatric/Behavioral: Negative.    Physical Exam   Blood pressure 112/67, pulse 98, temperature 98.2 F (36.8 C), temperature source Oral, resp. rate 20, last menstrual period 11/19/2011, SpO2 100.00%.  Physical Exam  Nursing note and vitals reviewed. Constitutional: She is oriented to person, place, and time. She appears well-developed and well-nourished. No distress.  Cardiovascular: Normal rate.   Respiratory: Effort normal.  GI: Soft. There is no tenderness.  Musculoskeletal: Normal range of motion.  Neurological: She is alert and oriented to person, place, and time.  Skin: Skin is warm and dry.  Psychiatric: She has a normal mood and affect.    MAU Course  Procedures Reassuring FHR at 24 weeks, TOCO quiet.   Assessment and Plan   1. Opioid dependence   2. Drug dependence in pregnancy, second trimester    Transfer to [facility], Dr. Radford Pax accepting    Medication List    ASK your doctor about these medications       ibuprofen  200 MG tablet  Commonly known as:  ADVIL,MOTRIN  Take 800 mg by mouth every 6 (six) hours as needed for pain or headache.     oxyCODONE 15 MG immediate release tablet  Commonly known as:  ROXICODONE  Take 15 mg by mouth every 8 (eight) hours as needed for pain.     prenatal multivitamin Tabs  Take 1 tablet by mouth daily at 12 noon. (May be purchased from over the counter):For prenatal care     zolpidem 10 MG tablet  Commonly known as:  AMBIEN  Take 10 mg by mouth at bedtime as needed for sleep.            Kahner Yanik 05/25/2012, 7:18 PM

## 2012-05-26 DIAGNOSIS — O99340 Other mental disorders complicating pregnancy, unspecified trimester: Secondary | ICD-10-CM

## 2012-05-26 DIAGNOSIS — F909 Attention-deficit hyperactivity disorder, unspecified type: Secondary | ICD-10-CM

## 2012-05-26 DIAGNOSIS — F1120 Opioid dependence, uncomplicated: Secondary | ICD-10-CM

## 2012-05-26 LAB — RAPID URINE DRUG SCREEN, HOSP PERFORMED
Amphetamines: NOT DETECTED
Barbiturates: NOT DETECTED
Tetrahydrocannabinol: NOT DETECTED

## 2012-05-26 LAB — PREGNANCY, URINE: Preg Test, Ur: POSITIVE — AB

## 2012-05-26 NOTE — BH Assessment (Signed)
Assessment Note   Alyssa Hansen is an 22 y.o. female. Pt is pregnant and requesting detox from oxycodone.  Reports she was detoxed at [facility] 04/2012 but relapsed soon after.  Pt reports using 100-300mg  oxycodone daily for past 3 weeks.  Pt reports she does get withdrawal symptoms but generally continues to use in order to avoid them.  Pt reports her withdrawals have not yet started.  Pt reports her mother recently kicked her out of the house and she has been staying "place to place" for the past week.  Pt denies depression, states substance use is the main stressor in her life.  Denies SI/HI/AV.    Axis I: opioide dependence Axis II: Deferred Axis III:  Past Medical History  Diagnosis Date  . Scoliosis   . Restless legs   . Bipolar 1 disorder, depressed   . Anxiety   . Depression   . ADHD (attention deficit hyperactivity disorder)   . ADHD (attention deficit hyperactivity disorder)   . Obesity   . Substance abuse   . ADHD (attention deficit hyperactivity disorder)   . History of suicidal tendencies   . Herpes     + blood test, never had outbreak  . Chlamydia   . Hypothyroidism     No meds.  Marland Kitchen PCOS (polycystic ovarian syndrome)   . Trichomonas   . Drug abuse    Axis IV: housing problems Axis V: 51-60 moderate symptoms  Past Medical History:  Past Medical History  Diagnosis Date  . Scoliosis   . Restless legs   . Bipolar 1 disorder, depressed   . Anxiety   . Depression   . ADHD (attention deficit hyperactivity disorder)   . ADHD (attention deficit hyperactivity disorder)   . Obesity   . Substance abuse   . ADHD (attention deficit hyperactivity disorder)   . History of suicidal tendencies   . Herpes     + blood test, never had outbreak  . Chlamydia   . Hypothyroidism     No meds.  Marland Kitchen PCOS (polycystic ovarian syndrome)   . Trichomonas   . Drug abuse     Past Surgical History  Procedure Laterality Date  . Cesarean section    . Laparoscopic abdominal exploration       Family History:  Family History  Problem Relation Age of Onset  . Alcohol abuse Neg Hx   . Arthritis Neg Hx   . Asthma Neg Hx   . Birth defects Neg Hx   . Cancer Neg Hx   . Depression Neg Hx   . COPD Neg Hx   . Drug abuse Neg Hx   . Early death Neg Hx   . Hearing loss Neg Hx   . Hypertension Father   . Hypertension Maternal Grandmother   . Hypertension Maternal Grandfather   . Heart disease Maternal Grandfather   . Diabetes Maternal Grandfather     Social History:  reports that she has been smoking Cigarettes.  She has been smoking about 1.00 pack per day. She has never used smokeless tobacco. She reports that she uses illicit drugs (Oxycodone, Cocaine, and Marijuana). She reports that she does not drink alcohol.  Additional Social History:  Alcohol / Drug Use Pain Medications: oxycodone/opiates Prescriptions: oxycodone History of alcohol / drug use?: Yes Negative Consequences of Use: Financial;Legal;Personal relationships;Work / Mining engineer #1 Name of Substance 1: oxycodone 1 - Age of First Use: 14 1 - Amount (size/oz): 100-300mg  1 - Frequency: daily 1 - Duration:  3 weeks 1 - Last Use / Amount: 5/22, 90mg  Substance #2 Name of Substance 2: cocaine 2 - Age of First Use: 14 2 - Amount (size/oz): Pt denies regular use 2 - Last Use / Amount: 5/21, 1 line  CIWA: CIWA-Ar BP: 135/77 mmHg Pulse Rate: 95 COWS: Clinical Opiate Withdrawal Scale (COWS) Resting Pulse Rate: Pulse Rate 81-100 Sweating: Flushed or Observable moistness on face Restlessness: Able to sit still Pupil Size: Pupils pinned or normal size for room light Bone or Joint Aches: Mild diffuse discomfort Runny Nose or Tearing: Not present GI Upset: No GI symptoms Tremor: No tremor Yawning: No yawning Anxiety or Irritability: None Gooseflesh Skin: Skin is smooth COWS Total Score: 4  Allergies:  Allergies  Allergen Reactions  . Amoxicillin Swelling  . Cefaclor Swelling and Rash    Home  Medications:  (Not in a hospital admission)  OB/GYN Status:  Patient's last menstrual period was 11/19/2011.  General Assessment Data Location of Assessment: [facility] ED ACT Assessment: Yes Living Arrangements: Non-relatives/Friends Can pt return to current living arrangement?: Yes Admission Status: Voluntary Is patient capable of signing voluntary admission?: Yes     Risk to self Suicidal Ideation: No Suicidal Intent: No Is patient at risk for suicide?: No Suicidal Plan?: No Access to Means: No What has been your use of drugs/alcohol within the last 12 months?: current opiate use Previous Attempts/Gestures: No Intentional Self Injurious Behavior: None Family Suicide History: No Recent stressful life event(s): Other (Comment) (drug use) Persecutory voices/beliefs?: No Depression: No Substance abuse history and/or treatment for substance abuse?: Yes Suicide prevention information given to non-admitted patients: Not applicable  Risk to Others Homicidal Ideation: No Thoughts of Harm to Others: No Current Homicidal Intent: No Current Homicidal Plan: No Access to Homicidal Means: No History of harm to others?: No Assessment of Violence: None Noted Does patient have access to weapons?: No Criminal Charges Pending?: No Does patient have a court date: No  Psychosis Hallucinations: None noted Delusions: None noted  Mental Status Report Appear/Hygiene: Other (Comment) (casual;) Eye Contact: Good Motor Activity: Unremarkable Speech: Logical/coherent Level of Consciousness: Alert Mood: Other (Comment) (cooperative) Affect: Appropriate to circumstance Anxiety Level: Minimal Thought Processes: Coherent;Relevant Judgement: Unimpaired Orientation: Person;Place;Time;Situation Obsessive Compulsive Thoughts/Behaviors: None  Cognitive Functioning Concentration: Normal Memory: Recent Intact;Remote Intact IQ: Average Insight: Good Impulse Control: Poor Appetite:  (up and  down) Weight Loss: 5 Weight Gain: 0 Sleep: No Change Total Hours of Sleep: 6 Vegetative Symptoms: None  ADLScreening [facility] Services) Patient's cognitive ability adequate to safely complete daily activities?: Yes Patient able to express need for assistance with ADLs?: Yes Independently performs ADLs?: Yes (appropriate for developmental age)  Abuse/Neglect [facility]) Physical Abuse: Denies Verbal Abuse: Denies Sexual Abuse: Denies  Prior Inpatient Therapy Prior Inpatient Therapy: Yes (also [facility] 2011, [facility] 2010) Prior Therapy Dates: 04/2012 Prior Therapy Facilty/Provider(s): [facility] Reason for Treatment: detox  Prior Outpatient Therapy Prior Outpatient Therapy: No  ADL Screening (condition at time of admission) Patient's cognitive ability adequate to safely complete daily activities?: Yes Patient able to express need for assistance with ADLs?: Yes Independently performs ADLs?: Yes (appropriate for developmental age) Weakness of Legs: None Weakness of Arms/Hands: None       Abuse/Neglect Assessment (Assessment to be complete while patient is alone) Physical Abuse: Denies Verbal Abuse: Denies Sexual Abuse: Denies Exploitation of patient/patient's resources: Denies Self-Neglect: Denies     Merchant navy officer (For Healthcare) Advance Directive: Patient does not have advance directive;Patient would not like information  Additional Information 1:1 In Past 12 Months?: No CIRT Risk: No Elopement Risk: No Does patient have medical clearance?: Yes     Disposition: Recommend withdrawals be reevaluated in the AM to see how significant they are.  Minimal withdrawals currently but pt reports recent use.  No UDS back yet. Disposition Initial Assessment Completed for this Encounter:  (not yet.  No drug screen, no withdrawals have begun.)  On Site Evaluation by:   Reviewed with Physician:     Lorri Frederick 05/26/2012 12:31 AM

## 2012-05-26 NOTE — Progress Notes (Addendum)
CSW met with pt at bedside and offered outpatient resources. Patient shared that she is hoping to return to her mothers home. Patient states that she is not sure what she wants to do about outpatient treatment services at this time. Patient was calling patient father to see if he can provide transportation home to mothers house.   Catha Gosselin, Theresia Majors  [phone] .05/26/2012 1127am   Addendum: Patient states he was not able to reach her dad, and so her mother agreed to come get patient. Pt also states she plans to follow up with her outpatient provider, Aram Beecham.   Catha Gosselin, LCSWA  [phone] .05/26/2012 12:44am

## 2012-05-26 NOTE — ED Notes (Signed)
Pt upset that she is being discharged and is trying to find a ride home. Tearful and verbalizing frustration. Continues to deny SI and HI.

## 2012-05-26 NOTE — [facility] Suicide Risk Assessment (Signed)
Suicide Risk Assessment  Discharge Assessment     Demographic Factors:  Adolescent or young adult, Caucasian, Low socioeconomic status and Unemployed  Mental Status Per Nursing Assessment::   On Admission:     Current Mental Status by Physician: NA  Loss Factors: Financial problems/change in socioeconomic status  Historical Factors: Family history of mental illness or substance abuse and Impulsivity  Risk Reduction Factors:   Living with another person, especially a relative and Positive social support  Continued Clinical Symptoms:  Alcohol/Substance Abuse/Dependencies  Cognitive Features That Contribute To Risk:  Polarized thinking    Suicide Risk:  Minimal: No identifiable suicidal ideation.  Patients presenting with no risk factors but with morbid ruminations; may be classified as minimal risk based on the severity of the depressive symptoms  Discharge Diagnoses:   AXIS I:  Substance Abuse AXIS II:  Deferred AXIS III:   Past Medical History  Diagnosis Date  . Scoliosis   . Restless legs   . Bipolar 1 disorder, depressed   . Anxiety   . Depression   . ADHD (attention deficit hyperactivity disorder)   . ADHD (attention deficit hyperactivity disorder)   . Obesity   . Substance abuse   . ADHD (attention deficit hyperactivity disorder)   . History of suicidal tendencies   . Herpes     + blood test, never had outbreak  . Chlamydia   . Hypothyroidism     No meds.  Marland Kitchen PCOS (polycystic ovarian syndrome)   . Trichomonas   . Drug abuse    AXIS IV:  economic problems, educational problems, housing problems, occupational problems, other psychosocial or environmental problems and problems related to social environment AXIS V:  51-60 moderate symptoms  Plan Of Care/Follow-up recommendations:  Activity:  as tolerated Diet:  regular  Is patient on multiple antipsychotic therapies at discharge:  No   Has Patient had three or more failed trials of antipsychotic  monotherapy by history:  No  Recommended Plan for Multiple Antipsychotic Therapies: Not applicable  Jaqwan Wieber,JANARDHAHA R. 05/26/2012, 11:25 AM

## 2012-05-26 NOTE — Consult Note (Signed)
Reason for Consult: Substance abuse Referring Physician: Dr.Rancour  Alyssa Hansen Alyssa Hansen is an 22 y.o. female.  HPI: Patient came to the [facility] long emergency department requesting treatment for substance abuse. Reportedly patient has been [redacted] weeks pregnant and requesting detox from oxycodone. Patient was recently detoxed at [facility] 04/2012 but relapsed after 05/12/2012, reportedly she had ankle injury. Pt reports using 100-300 mg oxycodone daily for past 3 weeks. Pt reports she does get withdrawal symptoms.  Patient has no current symptoms of withdrawals  and a urine drug screen was negative for opiates and positive for cocaine. Pt reports her mother recently kicked her out of the house and she has been staying "place to place" for the past week. Pt denies depression, states substance use is the main stressor in her life. Patient stated that she is going to call her grandmother regarding her disposition plans.   Mental Status Examination: Patient appeared as per his stated age, casually dressed, and fairly groomed, and maintaining good eye contact. Patient has good mood and his affect was constricted. He has normal rate, rhythm, and volume of speech. His thought process is linear and goal directed. Patient has denied suicidal, homicidal ideations, intentions or plans. Patient has no evidence of auditory or visual hallucinations, delusions, and paranoia. Patient has fair insight judgment and impulse control.  Past Medical History  Diagnosis Date  . Scoliosis   . Restless legs   . Bipolar 1 disorder, depressed   . Anxiety   . Depression   . ADHD (attention deficit hyperactivity disorder)   . ADHD (attention deficit hyperactivity disorder)   . Obesity   . Substance abuse   . ADHD (attention deficit hyperactivity disorder)   . History of suicidal tendencies   . Herpes     + blood test, never had outbreak  . Chlamydia   . Hypothyroidism     No meds.  Marland Kitchen PCOS (polycystic ovarian syndrome)   . Trichomonas    . Drug abuse     Past Surgical History  Procedure Laterality Date  . Cesarean section    . Laparoscopic abdominal exploration      Family History  Problem Relation Age of Onset  . Alcohol abuse Neg Hx   . Arthritis Neg Hx   . Asthma Neg Hx   . Birth defects Neg Hx   . Cancer Neg Hx   . Depression Neg Hx   . COPD Neg Hx   . Drug abuse Neg Hx   . Early death Neg Hx   . Hearing loss Neg Hx   . Hypertension Father   . Hypertension Maternal Grandmother   . Hypertension Maternal Grandfather   . Heart disease Maternal Grandfather   . Diabetes Maternal Grandfather     Social History:  reports that she has been smoking Cigarettes.  She has been smoking about 1.00 pack per day. She has never used smokeless tobacco. She reports that she uses illicit drugs (Oxycodone, Cocaine, and Marijuana). She reports that she does not drink alcohol.  Allergies:  Allergies  Allergen Reactions  . Amoxicillin Swelling  . Cefaclor Swelling and Rash    Medications: I have reviewed the patient's current medications.  Results for orders placed during the hospital encounter of 05/25/12 (from the past 48 hour(s))  ACETAMINOPHEN LEVEL     Status: None   Collection Time    05/25/12  8:55 PM      Result Value Range   Acetaminophen (Tylenol), Serum <15.0  10 - 30  ug/mL   Comment:            THERAPEUTIC CONCENTRATIONS VARY     SIGNIFICANTLY. A RANGE OF 10-30     ug/mL MAY BE AN EFFECTIVE     CONCENTRATION FOR MANY PATIENTS.     HOWEVER, SOME ARE BEST TREATED     AT CONCENTRATIONS OUTSIDE THIS     RANGE.     ACETAMINOPHEN CONCENTRATIONS     >150 ug/mL AT 4 HOURS AFTER     INGESTION AND >50 ug/mL AT 12     HOURS AFTER INGESTION ARE     OFTEN ASSOCIATED WITH TOXIC     REACTIONS.  CBC     Status: Abnormal   Collection Time    05/25/12  8:55 PM      Result Value Range   WBC 12.7 (*) 4.0 - 10.5 K/uL   RBC 4.04  3.87 - 5.11 MIL/uL   Hemoglobin 10.6 (*) 12.0 - 15.0 g/dL   HCT 16.1 (*) 09.6 -  46.0 %   MCV 81.9  78.0 - 100.0 fL   MCH 26.2  26.0 - 34.0 pg   MCHC 32.0  30.0 - 36.0 g/dL   RDW 04.5  40.9 - 81.1 %   Platelets 270  150 - 400 K/uL  COMPREHENSIVE METABOLIC PANEL     Status: Abnormal   Collection Time    05/25/12  8:55 PM      Result Value Range   Sodium 138  135 - 145 mEq/L   Potassium 3.3 (*) 3.5 - 5.1 mEq/L   Chloride 100  96 - 112 mEq/L   CO2 28  19 - 32 mEq/L   Glucose, Bld 74  70 - 99 mg/dL   BUN 4 (*) 6 - 23 mg/dL   Creatinine, Ser 9.14  0.50 - 1.10 mg/dL   Calcium 9.1  8.4 - 78.2 mg/dL   Total Protein 6.6  6.0 - 8.3 g/dL   Albumin 2.4 (*) 3.5 - 5.2 g/dL   AST 18  0 - 37 U/L   ALT 15  0 - 35 U/L   Alkaline Phosphatase 72  39 - 117 U/L   Total Bilirubin 0.3  0.3 - 1.2 mg/dL   GFR calc non Af Amer >90  >90 mL/min   GFR calc Af Amer >90  >90 mL/min   Comment:            The eGFR has been calculated     using the CKD EPI equation.     This calculation has not been     validated in all clinical     situations.     eGFR's persistently     <90 mL/min signify     possible Chronic Kidney Disease.  ETHANOL     Status: None   Collection Time    05/25/12  8:55 PM      Result Value Range   Alcohol, Ethyl (B) <11  0 - 11 mg/dL   Comment:            LOWEST DETECTABLE LIMIT FOR     SERUM ALCOHOL IS 11 mg/dL     FOR MEDICAL PURPOSES ONLY  SALICYLATE LEVEL     Status: Abnormal   Collection Time    05/25/12  8:55 PM      Result Value Range   Salicylate Lvl <2.0 (*) 2.8 - 20.0 mg/dL  URINE RAPID DRUG SCREEN (HOSP PERFORMED)     Status: Abnormal  Collection Time    05/26/12  3:37 AM      Result Value Range   Opiates NONE DETECTED  NONE DETECTED   Cocaine POSITIVE (*) NONE DETECTED   Benzodiazepines NONE DETECTED  NONE DETECTED   Amphetamines NONE DETECTED  NONE DETECTED   Tetrahydrocannabinol NONE DETECTED  NONE DETECTED   Barbiturates NONE DETECTED  NONE DETECTED   Comment:            DRUG SCREEN FOR MEDICAL PURPOSES     ONLY.  IF CONFIRMATION IS  NEEDED     FOR ANY PURPOSE, NOTIFY LAB     WITHIN 5 DAYS.                LOWEST DETECTABLE LIMITS     FOR URINE DRUG SCREEN     Drug Class       Cutoff (ng/mL)     Amphetamine      1000     Barbiturate      200     Benzodiazepine   200     Tricyclics       300     Opiates          300     Cocaine          300     THC              50  PREGNANCY, URINE     Status: Abnormal   Collection Time    05/26/12  3:40 AM      Result Value Range   Preg Test, Ur POSITIVE (*) NEGATIVE   Comment:            THE SENSITIVITY OF THIS     METHODOLOGY IS >20 mIU/mL.    No results found.  Positive for illegal drug usage Blood pressure 130/83, pulse 96, temperature 98.4 F (36.9 C), temperature source Oral, resp. rate 18, last menstrual period 11/19/2011, SpO2 96.00%.   Assessment/Plan: Opiord dependence  Cocaine intoxication 24 weeks of gestation  Recommendation: Recommended chemical dependency intensive outpatient treatment program as she does not have symptoms of opioid withdrawal at this time in her urine drug screen was negative for opiates.  Xachary Hambly,JANARDHAHA R. 05/26/2012, 11:15 AM

## 2012-05-26 NOTE — ED Provider Notes (Signed)
[redacted] weeks pregnant requesting detox from opiates and cocaine. No abdominal pain or vaginal bleeding. Saw obstetrics yesterday. No pregnancy related concerns.  BP 130/83  Pulse 96  Temp(Src) 98.4 F (36.9 C) (Oral)  Resp 18  SpO2 96%  LMP 11/19/2011   Glynn Octave, MD 05/26/12 [phone]

## 2012-05-28 NOTE — ED Provider Notes (Signed)
Medical screening examination/treatment/procedure(s) were performed by non-physician practitioner and as supervising physician I was immediately available for consultation/collaboration.   Nelia Shi, MD 05/28/12 1102

## 2012-06-13 ENCOUNTER — Ambulatory Visit (HOSPITAL_COMMUNITY): Admission: RE | Admit: 2012-06-13 | Payer: BC Managed Care – PPO | Source: Ambulatory Visit

## 2012-11-09 ENCOUNTER — Other Ambulatory Visit: Payer: Self-pay

## 2013-02-17 ENCOUNTER — Encounter (HOSPITAL_COMMUNITY): Payer: Self-pay | Admitting: *Deleted

## 2013-11-05 ENCOUNTER — Encounter (HOSPITAL_COMMUNITY): Payer: Self-pay | Admitting: *Deleted

## 2016-09-25 ENCOUNTER — Emergency Department (HOSPITAL_COMMUNITY): Payer: Medicaid Other

## 2016-09-25 ENCOUNTER — Encounter (HOSPITAL_COMMUNITY): Payer: Self-pay | Admitting: *Deleted

## 2016-09-25 ENCOUNTER — Emergency Department (HOSPITAL_COMMUNITY)
Admission: EM | Admit: 2016-09-25 | Discharge: 2016-09-26 | Disposition: A | Discharge status: Home / Self Care | Payer: Medicaid Other | Attending: Emergency Medicine | Admitting: Emergency Medicine

## 2016-09-25 DIAGNOSIS — E039 Hypothyroidism, unspecified: Secondary | ICD-10-CM | POA: Insufficient documentation

## 2016-09-25 DIAGNOSIS — Z79899 Other long term (current) drug therapy: Secondary | ICD-10-CM | POA: Diagnosis not present

## 2016-09-25 DIAGNOSIS — M5431 Sciatica, right side: Secondary | ICD-10-CM | POA: Diagnosis not present

## 2016-09-25 DIAGNOSIS — F17210 Nicotine dependence, cigarettes, uncomplicated: Secondary | ICD-10-CM | POA: Insufficient documentation

## 2016-09-25 DIAGNOSIS — M545 Low back pain: Secondary | ICD-10-CM | POA: Diagnosis present

## 2016-09-25 MED ORDER — DIAZEPAM 5 MG PO TABS
5.0000 mg | ORAL_TABLET | Freq: Once | ORAL | Status: AC
  Administered 2016-09-25: 5 mg via ORAL
  Filled 2016-09-25: qty 1

## 2016-09-25 MED ORDER — KETOROLAC TROMETHAMINE 60 MG/2ML IM SOLN
60.0000 mg | Freq: Once | INTRAMUSCULAR | Status: AC
  Administered 2016-09-25: 60 mg via INTRAMUSCULAR
  Filled 2016-09-25: qty 2

## 2016-09-25 NOTE — ED Triage Notes (Signed)
Back pain for months

## 2016-09-26 LAB — POC URINE PREG, ED: PREG TEST UR: NEGATIVE

## 2016-09-26 MED ORDER — CYCLOBENZAPRINE HCL 10 MG PO TABS
10.0000 mg | ORAL_TABLET | Freq: Three times a day (TID) | ORAL | 0 refills | Status: DC
Start: 2016-09-26 — End: 2020-03-19

## 2016-09-26 MED ORDER — PREDNISONE 10 MG PO TABS: ORAL_TABLET | ORAL | 0 refills | Status: DC

## 2016-09-26 NOTE — Discharge Instructions (Signed)
Alternate ice and heat to your back.  Follow-up with your primary provider when you return home.

## 2016-09-26 NOTE — ED Provider Notes (Signed)
[facility] DEPT Provider Note   CSN: [id number] Arrival date & time: 09/25/16  2208     History   Chief Complaint Chief Complaint  Patient presents with  . Back Pain    HPI Alyssa Hansen is a 26 y.o. female.  HPI   Alyssa Hansen is a 26 y.o. female who presents to the Emergency Department complaining of increasing low back pain.  Pain began in April of this year after having her child and pain has been gradually worsening.  She complains of stabbing pain across her lower back and radiating into her right leg.  Symptoms are associated with intermittent numbness and tingling sensation in her leg with certain positions.  Symptoms improve when supine, worse with bending and weight bearing.  She has tried OTC pain relievers without improvement.  She denies recent injury, abdominal pain, fever, chills, extremity weakness, urine or bowel changes.     Past Medical History:  Diagnosis Date  . ADHD (attention deficit hyperactivity disorder)   . ADHD (attention deficit hyperactivity disorder)   . ADHD (attention deficit hyperactivity disorder)   . Anxiety   . Bipolar 1 disorder, depressed (HCC)   . Chlamydia   . Depression   . Drug abuse   . Herpes    + blood test, never had outbreak  . History of suicidal tendencies   . Hypothyroidism    No meds.  . Obesity   . PCOS (polycystic ovarian syndrome)   . Restless legs   . Scoliosis   . Substance abuse   . Trichomonas     Patient Active Problem List   Diagnosis Date Noted  . Opioid dependence (HCC) 04/15/2012  . Cocaine abuse 04/15/2012  . Opioid use with withdrawal (HCC) 04/15/2012  . Substance induced mood disorder (HCC) 04/15/2012  . LOW BACK PAIN 08/02/2007    Past Surgical History:  Procedure Laterality Date  . CESAREAN SECTION    . LAPAROSCOPIC ABDOMINAL EXPLORATION      OB History    Gravida Para Term Preterm AB Living   SAB TAB Ectopic Multiple Live Births                   Home  Medications    Prior to Admission medications   Medication Sig Start Date End Date Taking? Authorizing Provider  acetaminophen (TYLENOL) 500 MG tablet Take 1,000 mg by mouth every 6 (six) hours as needed for moderate pain.   Yes [provider]  ADDERALL XR 30 MG 24 hr capsule Take 1 capsule by mouth daily. 09/08/16  Yes [provider]  buprenorphine (SUBUTEX) 8 MG SUBL SL tablet Place 1 tablet under the tongue 2 (two) times daily. 09/08/16  Yes [provider]  cholecalciferol (VITAMIN D) 1000 units tablet Take 4,000 Units by mouth daily.   Yes [provider]  FORFIVO XL 450 MG TB24 Take 1 tablet by mouth daily. 08/22/16  Yes [provider]  lamoTRIgine (LAMICTAL) 200 MG tablet Take 1 tablet by mouth daily. 09/08/16  Yes [provider]  Lido-Capsaicin-Men-Methyl Sal (MEDI-PATCH-LIDOCAINE EX) Apply 1 patch topically daily.   Yes [provider]  Prenatal Vit-Fe Fumarate-FA (PRENATAL MULTIVITAMIN) TABS tablet Take 1 tablet by mouth daily at 12 noon.   Yes [provider]  QUEtiapine (SEROQUEL) 50 MG tablet Take 1 tablet by mouth 2 (two) times daily as needed. 09/08/16  Yes [provider]    Family  History Family History  Problem Relation Age of Onset  . Hypertension Father   . Hypertension Maternal Grandmother   . Hypertension Maternal Grandfather   . Heart disease Maternal Grandfather   . Diabetes Maternal Grandfather   . Alcohol abuse Neg Hx   . Arthritis Neg Hx   . Asthma Neg Hx   . Birth defects Neg Hx   . Cancer Neg Hx   . Depression Neg Hx   . COPD Neg Hx   . Drug abuse Neg Hx   . Early death Neg Hx   . Hearing loss Neg Hx     Social History Social History  Substance Use Topics  . Smoking status: Current Every Day Smoker    Packs/day: 1.00    Types: Cigarettes  . Smokeless tobacco: Never Used  . Alcohol use No     Allergies   Amoxicillin and Cefaclor   Review of Systems Review of  Systems  Constitutional: Negative for fever.  Respiratory: Negative for shortness of breath.   Gastrointestinal: Negative for abdominal pain, constipation and vomiting.  Genitourinary: Negative for decreased urine volume, difficulty urinating, dysuria, flank pain and hematuria.  Musculoskeletal: Positive for back pain. Negative for joint swelling.  Skin: Negative for rash.  Neurological: Negative for weakness and numbness.  All other systems reviewed and are negative.    Physical Exam Updated Vital Signs BP 116/74 (BP Location: Left Arm)   Pulse 87   Temp 98 F (36.7 C) (Oral)   Resp 20   Ht  (1.626 m)   Wt 134.3 kg (296 lb)   SpO2 100%   BMI 50.81 kg/m   Physical Exam  Constitutional: She is oriented to person, place, and time. She appears well-developed and well-nourished. No distress.  HENT:  Head: Normocephalic and atraumatic.  Neck: Normal range of motion. Neck supple.  Cardiovascular: Normal rate, regular rhythm, normal heart sounds and intact distal pulses.   No murmur heard. Pulmonary/Chest: Effort normal and breath sounds normal. No respiratory distress.  Abdominal: Soft. She exhibits no distension. There is no tenderness.  Musculoskeletal: She exhibits tenderness. She exhibits no edema.       Lumbar back: She exhibits tenderness and pain. She exhibits normal range of motion, no swelling, no deformity, no laceration and normal pulse.  ttp of the lower lumbar spine, right SI joint and bilateral lumbar paraspinal muscles.   Positive SLR on right at 30 degrees. Pt has 5/5 strength against resistance of bilateral lower extremities.     Neurological: She is alert and oriented to person, place, and time. She has normal strength. No sensory deficit. She exhibits normal muscle tone. Coordination and gait normal.  Reflex Scores:      Patellar reflexes are 2+ on the right side and 2+ on the left side.      Achilles reflexes are 2+ on the right side and 2+ on the left  side. Skin: Skin is warm and dry. Capillary refill takes less than 2 seconds. No rash noted.  Nursing note and vitals reviewed.    ED Treatments / Results  Labs (all labs ordered are listed, but only abnormal results are displayed) Labs Reviewed  POC URINE PREG, ED    EKG  EKG Interpretation None       Radiology Dg Lumbar Spine Complete  Result Date: 09/26/2016 CLINICAL DATA:  Increasing low mid back pain for several months. No injury. EXAM: LUMBAR SPINE - COMPLETE 4+ VIEW COMPARISON:  None. FINDINGS: There is no  evidence of lumbar spine fracture. Alignment is normal. Intervertebral disc spaces are maintained. IMPRESSION: Negative. Electronically Signed   By: Burman Nieves M.D.   On: 09/26/2016 01:36    Procedures Procedures (including critical care time)  Medications Ordered in ED Medications  ketorolac (TORADOL) injection 60 mg (60 mg Intramuscular Given 09/25/16 2336)  diazepam (VALIUM) tablet 5 mg (5 mg Oral Given 09/25/16 2336)     Initial Impression / Assessment and Plan / ED Course  I have reviewed the triage vital signs and the nursing notes.  Pertinent labs & imaging results that were available during my care of the patient were reviewed by me and considered in my medical decision making (see chart for details).     Pt is well appearing, ambulatory.  No focal neuro deficits.  No concerning sx's for spinal abscess, cauda equina.  Here visiting family, agrees to arrange PCP f/u when she returns home.  Appears stable for d/c  Final Clinical Impressions(s) / ED Diagnoses   Final diagnoses:  Sciatica of right side    New Prescriptions New Prescriptions   No medications on file     Pauline Aus, Cordelia Poche 09/26/16 0145    Zadie Rhine, MD 09/26/16 0401

## 2018-11-23 ENCOUNTER — Other Ambulatory Visit: Payer: Self-pay

## 2018-11-23 DIAGNOSIS — Z20822 Contact with and (suspected) exposure to covid-19: Secondary | ICD-10-CM

## 2018-11-26 LAB — NOVEL CORONAVIRUS, NAA: SARS-CoV-2, NAA: NOT DETECTED

## 2020-01-15 ENCOUNTER — Other Ambulatory Visit (HOSPITAL_BASED_OUTPATIENT_CLINIC_OR_DEPARTMENT_OTHER): Payer: Self-pay

## 2020-01-15 DIAGNOSIS — G4700 Insomnia, unspecified: Secondary | ICD-10-CM

## 2020-01-15 DIAGNOSIS — G4710 Hypersomnia, unspecified: Secondary | ICD-10-CM

## 2020-01-15 DIAGNOSIS — R5383 Other fatigue: Secondary | ICD-10-CM

## 2020-01-15 DIAGNOSIS — R454 Irritability and anger: Secondary | ICD-10-CM

## 2020-02-24 ENCOUNTER — Ambulatory Visit (HOSPITAL_BASED_OUTPATIENT_CLINIC_OR_DEPARTMENT_OTHER): Payer: Self-pay | Attending: Psychiatry | Admitting: Internal Medicine

## 2020-02-24 ENCOUNTER — Other Ambulatory Visit: Payer: Self-pay

## 2020-02-24 VITALS — Ht 64.0 in | Wt 283.0 lb

## 2020-02-24 DIAGNOSIS — R454 Irritability and anger: Secondary | ICD-10-CM

## 2020-02-24 DIAGNOSIS — R5383 Other fatigue: Secondary | ICD-10-CM

## 2020-02-24 DIAGNOSIS — G4733 Obstructive sleep apnea (adult) (pediatric): Secondary | ICD-10-CM

## 2020-02-24 DIAGNOSIS — G4710 Hypersomnia, unspecified: Secondary | ICD-10-CM

## 2020-02-24 DIAGNOSIS — G4700 Insomnia, unspecified: Secondary | ICD-10-CM

## 2020-03-01 DIAGNOSIS — G4710 Hypersomnia, unspecified: Secondary | ICD-10-CM

## 2020-03-01 NOTE — Procedures (Signed)
Patient Name: Alyssa Hansen, Alyssa Hansen Date: 02/24/2020 Gender: Female D.O.B: [birth year] Age (years): 29 Referring Provider: Myer Haff MD Height (inches): 64 Interpreting Physician: Baird Lyons MD, ABSM Weight (lbs): 283 RPSGT: Gwenyth Allegra BMI: 68 MRN: [id number] Neck Size: 16.00  CLINICAL INFORMATION Sleep Study Type: Split Night CPAP Indication for sleep study: OSA Epworth Sleepiness Score: 21  SLEEP STUDY TECHNIQUE As per the AASM Manual for the Scoring of Sleep and Associated Events v2.3 (April 2016) with a hypopnea requiring 4% desaturations.  The channels recorded and monitored were frontal, central and occipital EEG, electrooculogram (EOG), submentalis EMG (chin), nasal and oral airflow, thoracic and abdominal wall motion, anterior tibialis EMG, snore microphone, electrocardiogram, and pulse oximetry. Continuous positive airway pressure (CPAP) was initiated when the patient met split night criteria and was titrated according to treat sleep-disordered breathing.  MEDICATIONS Medications self-administered by patient taken the night of the study : none reported  RESPIRATORY PARAMETERS Diagnostic  Total AHI (/hr): 18.2 RDI (/hr): 28.3 OA Index (/hr): - CA Index (/hr): 0.0 REM AHI (/hr): N/A NREM AHI (/hr): 18.2 Supine AHI (/hr): 64.3 Non-supine AHI (/hr): 6.1 Min O2 Sat (%): 89.0 Mean O2 (%): 94.0 Time below 88% (min): 0   Titration  Optimal Pressure (cm): 15 AHI at Optimal Pressure (/hr): 0.0 Min O2 at Optimal Pressure (%): 94.0 Supine % at Optimal (%): 100 Sleep % at Optimal (%): 97   SLEEP ARCHITECTURE The recording time for the entire night was 384.5 minutes.  During a baseline period of 156.4 minutes, the patient slept for 135.5 minutes in REM and nonREM, yielding a sleep efficiency of 86.6%%. Sleep onset after lights out was 5.8 minutes with a REM latency of N/A minutes. The patient spent 14.0%% of the night in stage N1 sleep, 86.0%% in stage N2 sleep, 0.0%% in  stage N3 and 0% in REM.  During the titration period of 227.1 minutes, the patient slept for 211.2 minutes in REM and nonREM, yielding a sleep efficiency of 93.0%%. Sleep onset after CPAP initiation was 10.4 minutes with a REM latency of 41.5 minutes. The patient spent 2.4%% of the night in stage N1 sleep, 69.2%% in stage N2 sleep, 3.3%% in stage N3 and 25.1% in REM.  CARDIAC DATA The 2 lead EKG demonstrated sinus rhythm. The mean heart rate was 100.0 beats per minute. Other EKG findings include: None.  LEG MOVEMENT DATA The total Periodic Limb Movements of Sleep (PLMS) were 0. The PLMS index was 0.0 .  IMPRESSIONS - Moderate obstructive sleep apnea occurred during the diagnostic portion of the study(AHI = 18.2/hour). An optimal PAP pressure was selected for this patient ( 15 cm of water) - No significant central sleep apnea occurred during the diagnostic portion of the study (CAI = 0.0/hour). - The patient had minimal or no oxygen desaturation during the diagnostic portion of the study (Min O2 = 89.0%). Minimum O2 sat on CPAP 15 was 94%. - The patient snored with loud snoring volume during the diagnostic portion of the study. - No cardiac abnormalities were noted during this study. - Clinically significant periodic limb movements did not occur during sleep.  DIAGNOSIS - Obstructive Sleep Apnea (G47.33)  RECOMMENDATIONS - Trial of CPAP therapy on 15 cm H2O or autopap 10-20. - Patient used a Small size [employer]kel Full Face Simplus mask and heated humidification. - Be careful with alcohol, sedatives and other CNS depressants that may worsen sleep apnea and disrupt normal sleep architecture. - Sleep hygiene should be reviewed to assess  factors that may improve sleep quality. - Weight management and regular exercise should be initiated or continued.  [Electronically signed] 03/01/2020 03:51 PM  Baird Lyons MD, [place], American Board of Sleep Medicine   NPI: [id number]                           [place], [place] [facility]  ELECTRONICALLY SIGNED ON:  03/01/2020, 3:48 PM [place] PH: [phone]   FX: [phone] [place]

## 2020-03-14 ENCOUNTER — Telehealth: Payer: Self-pay | Admitting: Internal Medicine

## 2020-03-14 NOTE — Telephone Encounter (Signed)
Called spoke with patient  Patient has not been seen since 2016. Sleep consult added to Dr. Roxy Cedar schedule for 04/16/20 at 10:30 Nothing further needed at this time.

## 2020-03-18 NOTE — Progress Notes (Signed)
03/19/20- 29 yoF current Smoker for sleep evaluation  Medical problem list includes Hypothyroid, PCOS, Hx Opioid use, Hx Cocaine use, ADHD, Anxiety, BiPolar, Hx Suicidal Tendency, Asthma, NPSG 02/24/20 (per Dr Stevphen Rochester Su)  AHI 18.2/ hr, desaturation to 89%, CPAP to 15, body weight 283 lbs Med list includes- Adderall XR 30, Xanax 1 mg, Buprenorphine-Naloxone BID, Lamictal,  Epworth score-22 Body weight today-290 lbs,  Covid vax-1 Moderna Flu vax-had Sleep study was ordered by Dr Janeece Riggers, her psychiatrist. She has long hx loud snoring. Remote sleep study elsewhere didn't show enough apnea for CPAP by her report. She has gained weight since then. Long hx of loud snoring. No ENT surgery. Wakes finding herself sitting edge of bed or propped on arm. No complex parasomnias. Daytime fatigue getting worse. Mild asthma- rarely needs rescue inhaler.  No hx heart disease or seizure.  Father on CPAP for OSA.  She has no insurance and will need some assistance. May be able to use a family member's old CPAP if that person can get replacement- supply chain delay.   Prior to Admission medications   Medication Sig Start Date End Date Taking? Authorizing Provider  acetaminophen (TYLENOL) 500 MG tablet Take 1,000 mg by mouth every 6 (six) hours as needed for moderate pain.   Yes [provider]  ADDERALL XR 30 MG 24 hr capsule Take 1 capsule by mouth daily. 09/08/16  Yes [provider]  albuterol (VENTOLIN HFA) 108 (90 Base) MCG/ACT inhaler Inhale 2 puffs into the lungs as needed. 02/11/16  Yes [provider]  ALPRAZolam Prudy Feeler) 1 MG tablet Take 1 mg by mouth as needed. 02/11/20  Yes [provider]  Buprenorphine HCl-Naloxone HCl 12-3 MG FILM Place 12 mg under the tongue in the morning and at bedtime.   Yes [provider]  cholecalciferol (VITAMIN D) 1000 units tablet Take 4,000 Units by mouth daily.   Yes [provider]  furosemide (LASIX) 40 MG tablet Take 40 mg by  mouth daily. 02/11/20  Yes [provider]  lamoTRIgine (LAMICTAL) 200 MG tablet Take 1 tablet by mouth daily. 09/08/16  Yes [provider]  levothyroxine (SYNTHROID) 100 MCG tablet Take 100 mcg by mouth daily. 02/19/20  Yes [provider]   Past Medical History:  Diagnosis Date  . ADHD (attention deficit hyperactivity disorder)   . ADHD (attention deficit hyperactivity disorder)   . ADHD (attention deficit hyperactivity disorder)   . Anxiety   . Bipolar 1 disorder, depressed (HCC)   . Chlamydia   . Depression   . Drug abuse (HCC)   . Herpes    + blood test, never had outbreak  . History of suicidal tendencies   . Hypothyroidism    No meds.  . Obesity   . PCOS (polycystic ovarian syndrome)   . Restless legs   . Scoliosis   . Substance abuse (HCC)   . Trichomonas    Past Surgical History:  Procedure Laterality Date  . CESAREAN SECTION    . LAPAROSCOPIC ABDOMINAL EXPLORATION     Family History  Problem Relation Age of Onset  . Hypertension Father   . Hypertension Maternal Grandmother   . Hypertension Maternal Grandfather   . Heart disease Maternal Grandfather   . Diabetes Maternal Grandfather   . Alcohol abuse Neg Hx   . Arthritis Neg Hx   . Asthma Neg Hx   . Birth defects Neg Hx   . Cancer Neg Hx   . Depression Neg Hx   .  COPD Neg Hx   . Drug abuse Neg Hx   . Early death Neg Hx   . Hearing loss Neg Hx    Social History   Socioeconomic History  . Marital status: Single    Spouse name: Not on file  . Number of children: Not on file  . Years of education: Not on file  . Highest education level: Not on file  Occupational History  . Not on file  Tobacco Use  . Smoking status: Current Every Day Smoker    Packs/day: 1.00    Types: Cigarettes  . Smokeless tobacco: Never Used  . Tobacco comment: 1/2 pack a day  Vaping Use  . Vaping Use: Never used  Substance and Sexual Activity  . Alcohol use: No  . Drug use: Yes    Types: Oxycodone,  Cocaine, Marijuana    Comment: takes anywhere between 60 to 200 mg. + UDS for cocaine according to prenatal record  . Sexual activity: Not on file  Other Topics Concern  . Not on file  Social History Narrative  . Not on file   Social Determinants of Health   Financial Resource Strain: Not on file  Food Insecurity: Not on file  Transportation Needs: Not on file  Physical Activity: Not on file  Stress: Not on file  Social Connections: Not on file  Intimate Partner Violence: Not on file   ROS-see HPI   + = positive Constitutional:    weight loss, night sweats, fevers, chills, +fatigue, lassitude. HEENT:    headaches, difficulty swallowing, tooth/dental problems, sore throat,       sneezing, itching, ear ache, nasal congestion, post nasal drip, snoring CV:    chest pain, orthopnea, PND, swelling in lower extremities, anasarca,                                   dizziness, palpitations Resp:   shortness of breath with exertion or at rest.                productive cough,   non-productive cough, coughing up of blood.              change in color of mucus.  wheezing.   Skin:    rash or lesions. GI:  No-   heartburn, indigestion, abdominal pain, nausea, vomiting, diarrhea,                 change in bowel habits, loss of appetite GU: dysuria, change in color of urine, no urgency or frequency.   flank pain. MS:   joint pain, stiffness, decreased range of motion, back pain. Neuro-     nothing unusual Psych:  change in mood or affect.  depression or anxiety.   memory loss.  OBJ- Physical Exam General- Alert, Oriented, Affect-appropriate, Distress- none acute, + obese Skin- rash-none, lesions- none, excoriation- none Lymphadenopathy- none Head- atraumatic        + stud upper lip            Eyes- Gross vision intact, PERRLA, conjunctivae and secretions clear            Ears- Hearing, canals-normal            Nose- Clear, no-Septal dev, mucus, polyps, erosion, perforation              Throat- Mallampati IV , mucosa clear , drainage- none, tonsils+, + few missing teeth Neck-  flexible , trachea midline, no stridor , thyroid nl, carotid no bruit Chest - symmetrical excursion , unlabored           Heart/CV- RRR , no murmur , no gallop  , no rub, nl s1 s2                           - JVD- none , edema- none, stasis changes- none, varices- none           Lung- clear to P&A, wheeze- none, cough- none , dullness-none, rub- none           Chest wall-  Abd-  Br/ Gen/ Rectal- Not done, not indicated Extrem- cyanosis- none, clubbing, none, atrophy- none, strength- nl Neuro- grossly intact to observation

## 2020-03-19 ENCOUNTER — Other Ambulatory Visit: Payer: Self-pay

## 2020-03-19 ENCOUNTER — Ambulatory Visit: Payer: Self-pay | Admitting: Internal Medicine

## 2020-03-19 ENCOUNTER — Encounter: Payer: Self-pay | Admitting: Internal Medicine

## 2020-03-19 VITALS — BP 122/80 | HR 109 | Temp 98.7°F | Ht 64.0 in | Wt 290.2 lb

## 2020-03-19 DIAGNOSIS — F1994 Other psychoactive substance use, unspecified with psychoactive substance-induced mood disorder: Secondary | ICD-10-CM

## 2020-03-19 DIAGNOSIS — G4733 Obstructive sleep apnea (adult) (pediatric): Secondary | ICD-10-CM | POA: Insufficient documentation

## 2020-03-19 NOTE — Assessment & Plan Note (Signed)
Complicated . Related problems and medication management will be by her psychiatrist.

## 2020-03-19 NOTE — Patient Instructions (Signed)
Order- new DME new CPAP ( may adjust a used machine)  Auto 5-20, mask of choice, humidifier, supplies, AirView/ card  Please call as needed

## 2020-03-19 NOTE — Assessment & Plan Note (Signed)
She has had a tough life, and medications/ other issues may contribute to her symptoms. Basics of OSA, medical issues, treatment options reviewed. Since she has no insurance, a hand-down machine from family could be very good solution. Hopefully it is an autopap machine. Plan- start CPAP application process, anticipating mask of choice, auto 5-20, humidifier and download capability.

## 2020-04-16 ENCOUNTER — Institutional Professional Consult (permissible substitution): Payer: Medicaid Other | Admitting: Internal Medicine

## 2020-04-29 ENCOUNTER — Institutional Professional Consult (permissible substitution): Payer: Medicaid Other | Admitting: Internal Medicine

## 2020-06-18 NOTE — Progress Notes (Deleted)
03/19/20- 29 yoF current Smoker for sleep evaluation  Medical problem list includes Hypothyroid, PCOS, Hx Opioid use, Hx Cocaine use, ADHD, Anxiety, BiPolar, Hx Suicidal Tendency, Asthma, NPSG 02/24/20 (per Dr Stevphen Rochester Su)  AHI 18.2/ hr, desaturation to 89%, CPAP to 15, body weight 283 lbs Med list includes- Adderall XR 30, Xanax 1 mg, Buprenorphine-Naloxone BID, Lamictal,  Epworth score-22 Body weight today-290 lbs,  Covid vax-1 Moderna Flu vax-had Sleep study was ordered by Dr Janeece Riggers, her psychiatrist. She has long hx loud snoring. Remote sleep study elsewhere didn't show enough apnea for CPAP by her report. She has gained weight since then. Long hx of loud snoring. No ENT surgery. Wakes finding herself sitting edge of bed or propped on arm. No complex parasomnias. Daytime fatigue getting worse. Mild asthma- rarely needs rescue inhaler.  No hx heart disease or seizure.  Father on CPAP for OSA.  She has no insurance and will need some assistance. May be able to use a family member's old CPAP if that person can get replacement- supply chain delay.   06/19/20- 29 yoF current Smoker followed for  OSA, complicated by Hypothyroid, PCOS, Hx Opioid use, Hx Cocaine use, ADHD, Anxiety, BiPolar, Hx Suicidal Tendency, Asthma, - Adderall XR 30, Xanax 1 mg, Buprenorphine-Naloxone BID, Lamictal,  CPAP auto 5-20/ [facility] Download- Body weight today- Covid vax-    ROS-see HPI   + = positive Constitutional:    weight loss, night sweats, fevers, chills, +fatigue, lassitude. HEENT:    headaches, difficulty swallowing, tooth/dental problems, sore throat,       sneezing, itching, ear ache, nasal congestion, post nasal drip, snoring CV:    chest pain, orthopnea, PND, swelling in lower extremities, anasarca,                                   dizziness, palpitations Resp:   shortness of breath with exertion or at rest.                productive cough,   non-productive cough, coughing up of blood.               change in color of mucus.  wheezing.   Skin:    rash or lesions. GI:  No-   heartburn, indigestion, abdominal pain, nausea, vomiting, diarrhea,                 change in bowel habits, loss of appetite GU: dysuria, change in color of urine, no urgency or frequency.   flank pain. MS:   joint pain, stiffness, decreased range of motion, back pain. Neuro-     nothing unusual Psych:  change in mood or affect.  depression or anxiety.   memory loss.  OBJ- Physical Exam General- Alert, Oriented, Affect-appropriate, Distress- none acute, + obese Skin- rash-none, lesions- none, excoriation- none Lymphadenopathy- none Head- atraumatic        + stud upper lip            Eyes- Gross vision intact, PERRLA, conjunctivae and secretions clear            Ears- Hearing, canals-normal            Nose- Clear, no-Septal dev, mucus, polyps, erosion, perforation             Throat- Mallampati IV , mucosa clear , drainage- none, tonsils+, + few missing teeth Neck- flexible , trachea midline, no stridor ,  thyroid nl, carotid no bruit Chest - symmetrical excursion , unlabored           Heart/CV- RRR , no murmur , no gallop  , no rub, nl s1 s2                           - JVD- none , edema- none, stasis changes- none, varices- none           Lung- clear to P&A, wheeze- none, cough- none , dullness-none, rub- none           Chest wall-  Abd-  Br/ Gen/ Rectal- Not done, not indicated Extrem- cyanosis- none, clubbing, none, atrophy- none, strength- nl Neuro- grossly intact to observation

## 2020-06-19 ENCOUNTER — Ambulatory Visit: Payer: Medicaid Other | Admitting: Internal Medicine

## 2020-10-15 NOTE — Progress Notes (Signed)
HPI F Smoker followed for OSA, complicated by  Hypothyroid, PCOS, Hx Opioid use, Hx Cocaine use, ADHD, Anxiety, BiPolar, Hx Suicidal Tendency, Asthma, NPSG 02/24/20 (per Dr Stevphen Rochester Su)  AHI 18.2/ hr, desaturation to 89%, CPAP to 15, body weight 283 lbs  ================================================================================  03/19/20- 29 yoF current Smoker for sleep evaluation  Medical problem list includes Hypothyroid, PCOS, Hx Opioid use, Hx Cocaine use, ADHD, Anxiety, BiPolar, Hx Suicidal Tendency, Asthma, NPSG 02/24/20 (per Dr Stevphen Rochester Su)  AHI 18.2/ hr, desaturation to 89%, CPAP to 15, body weight 283 lbs Med list includes- Adderall XR 30, Xanax 1 mg, Buprenorphine-Naloxone BID, Lamictal,  Epworth score-22 Body weight today-290 lbs,  Covid vax-1 Moderna Flu vax-had Sleep study was ordered by Dr Janeece Riggers, her psychiatrist. She has long hx loud snoring. Remote sleep study elsewhere didn't show enough apnea for CPAP by her report. She has gained weight since then. Long hx of loud snoring. No ENT surgery. Wakes finding herself sitting edge of bed or propped on arm. No complex parasomnias. Daytime fatigue getting worse. Mild asthma- rarely needs rescue inhaler.  No hx heart disease or seizure.  Father on CPAP for OSA.  She has no insurance and will need some assistance. May be able to use a family member's old CPAP if that person can get replacement- supply chain delay.   10/16/20- 30 yoF Smoker followed for OSA, complicated by  Hypothyroid, PCOS, Hx Opioid use, Hx Cocaine use, ADHD, Anxiety, BiPolar, Hx Suicidal Tendency, Asthma, Obesity, Tobacco User,  -meds include synthroid, ventolin hfa, xanax 1 mg, naloxone, lamictal, adderall XR 30 CPAP auto 5-20/ [facility]   ordered 03/19/20 Download-her phone- compliance 96%, AHI 1.1/ hr Body weight today-296 lbs- Covid vax-1 Moderna Flu vax-declines -----Pt states no concerns Download reviewed. Doing well. Denies interval issues or  changes.   ROS-see HPI   + = positive Constitutional:    weight loss, night sweats, fevers, chills, +fatigue, lassitude. HEENT:    headaches, difficulty swallowing, tooth/dental problems, sore throat,       sneezing, itching, ear ache, nasal congestion, post nasal drip, snoring CV:    chest pain, orthopnea, PND, swelling in lower extremities, anasarca,                                   dizziness, palpitations Resp:   shortness of breath with exertion or at rest.                productive cough,   non-productive cough, coughing up of blood.              change in color of mucus.  wheezing.   Skin:    rash or lesions. GI:  No-   heartburn, indigestion, abdominal pain, nausea, vomiting, diarrhea,                 change in bowel habits, loss of appetite GU: dysuria, change in color of urine, no urgency or frequency.   flank pain. MS:   joint pain, stiffness, decreased range of motion, back pain. Neuro-     nothing unusual Psych:  change in mood or affect.  depression or anxiety.   memory loss.  OBJ- Physical Exam General- Alert, Oriented, Affect-appropriate, Distress- none acute, + obese Skin- +tatoo Lymphadenopathy- none Head- atraumatic        + stud upper lip            Eyes- Gross  vision intact, PERRLA, conjunctivae and secretions clear            Ears- Hearing, canals-normal            Nose- Clear, no-Septal dev, mucus, polyps, erosion, perforation             Throat- Mallampati IV , mucosa clear , drainage- none, tonsils+, + few missing teeth Neck- flexible , trachea midline, no stridor , thyroid nl, carotid no bruit Chest - symmetrical excursion , unlabored           Heart/CV- RRR , no murmur , no gallop  , no rub, nl s1 s2                           - JVD- none , edema- none, stasis changes- none, varices- none           Lung- clear to P&A, wheeze- none, cough- none , dullness-none, rub- none           Chest wall-  Abd-  Br/ Gen/ Rectal- Not done, not indicated Extrem-  cyanosis- none, clubbing, none, atrophy- none, strength- nl Neuro- grossly intact to observation

## 2020-10-16 ENCOUNTER — Other Ambulatory Visit: Payer: Self-pay

## 2020-10-16 ENCOUNTER — Ambulatory Visit: Payer: Medicaid Other | Admitting: Internal Medicine

## 2020-10-16 ENCOUNTER — Encounter: Payer: Self-pay | Admitting: Internal Medicine

## 2020-10-16 VITALS — BP 132/62 | HR 101 | Temp 99.2°F | Ht 64.0 in | Wt 296.6 lb

## 2020-10-16 DIAGNOSIS — F17200 Nicotine dependence, unspecified, uncomplicated: Secondary | ICD-10-CM | POA: Diagnosis not present

## 2020-10-16 DIAGNOSIS — Z720 Tobacco use: Secondary | ICD-10-CM

## 2020-10-16 DIAGNOSIS — G4733 Obstructive sleep apnea (adult) (pediatric): Secondary | ICD-10-CM

## 2020-10-16 NOTE — Patient Instructions (Signed)
Order- DME [facility]- continue CPAP auto 5-20. Please add AirView/ card and send current download  Order- refer to Pharmacy smoking cessation program  Please call if we can help

## 2020-10-20 ENCOUNTER — Telehealth: Payer: Self-pay | Admitting: Pharmacist

## 2020-10-20 NOTE — Telephone Encounter (Signed)
Received notification that referral was placed for pharmacy smoking cessation program. Routing to [facility] pulm tobacco pool for f/u  Chesley Mires, PharmD, MPH, BCPS Clinical Pharmacist (Rheumatology and Pulmonology)

## 2020-11-08 ENCOUNTER — Other Ambulatory Visit: Payer: Self-pay

## 2020-11-08 ENCOUNTER — Encounter (HOSPITAL_COMMUNITY): Payer: Self-pay | Admitting: Emergency Medicine

## 2020-11-08 ENCOUNTER — Emergency Department (HOSPITAL_COMMUNITY)
Admission: EM | Admit: 2020-11-08 | Discharge: 2020-11-08 | Disposition: A | Discharge status: Home / Self Care | Payer: Medicaid Other | Attending: Emergency Medicine | Admitting: Emergency Medicine

## 2020-11-08 DIAGNOSIS — K0889 Other specified disorders of teeth and supporting structures: Secondary | ICD-10-CM | POA: Diagnosis present

## 2020-11-08 DIAGNOSIS — E039 Hypothyroidism, unspecified: Secondary | ICD-10-CM | POA: Diagnosis not present

## 2020-11-08 DIAGNOSIS — Z79899 Other long term (current) drug therapy: Secondary | ICD-10-CM | POA: Diagnosis not present

## 2020-11-08 DIAGNOSIS — F17210 Nicotine dependence, cigarettes, uncomplicated: Secondary | ICD-10-CM | POA: Insufficient documentation

## 2020-11-08 DIAGNOSIS — K047 Periapical abscess without sinus: Secondary | ICD-10-CM | POA: Insufficient documentation

## 2020-11-08 MED ORDER — CLINDAMYCIN HCL 300 MG PO CAPS: 300.0000 mg | ORAL_CAPSULE | Freq: Three times a day (TID) | ORAL | 0 refills | Status: AC

## 2020-11-08 MED ORDER — HYDROCODONE-ACETAMINOPHEN 5-325 MG PO TABS: 1.0000 | ORAL_TABLET | ORAL | 0 refills | Status: AC | PRN

## 2020-11-08 NOTE — ED Provider Notes (Addendum)
[facility] EMERGENCY DEPARTMENT Provider Note   CSN: [id number] Arrival date & time: 11/08/20  1124     History Chief Complaint  Patient presents with   Dental Pain    Alyssa Hansen is a 30 y.o. female.  The history is provided by the patient. No language interpreter was used.  Dental Pain Location:  Lower Lower teeth location:  23/LL lateral incisor Quality:  Aching Severity:  Severe Timing:  Constant Progression:  Worsening Chronicity:  New Context: dental caries   Previous work-up:  Dental exam Relieved by:  Nothing Worsened by:  Nothing Risk factors: lack of dental care       Past Medical History:  Diagnosis Date   ADHD (attention deficit hyperactivity disorder)    ADHD (attention deficit hyperactivity disorder)    ADHD (attention deficit hyperactivity disorder)    Anxiety    Bipolar 1 disorder, depressed (HCC)    Chlamydia    Depression    Drug abuse (HCC)    Herpes    + blood test, never had outbreak   History of suicidal tendencies    Hypothyroidism    No meds.   Obesity    PCOS (polycystic ovarian syndrome)    Restless legs    Scoliosis    Substance abuse (HCC)    Trichomonas     Patient Active Problem List   Diagnosis Date Noted   OSA (obstructive sleep apnea) 03/19/2020   Opioid dependence (HCC) 04/15/2012   Cocaine abuse (HCC) 04/15/2012   Opioid use with withdrawal (HCC) 04/15/2012   Substance induced mood disorder (HCC) 04/15/2012   LOW BACK PAIN 08/02/2007    Past Surgical History:  Procedure Laterality Date   CESAREAN SECTION     LAPAROSCOPIC ABDOMINAL EXPLORATION       OB History     Gravida  2   Para  1   Term  1   Preterm      AB      Living  1      SAB      IAB      Ectopic      Multiple      Live Births              Family History  Problem Relation Age of Onset   Hypertension Father    Hypertension Maternal Grandmother    Hypertension Maternal Grandfather    Heart disease Maternal  Grandfather    Diabetes Maternal Grandfather    Alcohol abuse Neg Hx    Arthritis Neg Hx    Asthma Neg Hx    Birth defects Neg Hx    Cancer Neg Hx    Depression Neg Hx    COPD Neg Hx    Drug abuse Neg Hx    Early death Neg Hx    Hearing loss Neg Hx     Social History   Tobacco Use   Smoking status: Every Day    Packs/day: 1.00    Types: Cigarettes   Smokeless tobacco: Never   Tobacco comments:    1/2 pack a day  Vaping Use   Vaping Use: Never used  Substance Use Topics   Alcohol use: No   Drug use: Yes    Types: Oxycodone, Cocaine, Marijuana    Comment: denies 11/08/20    Home Medications Prior to Admission medications   Medication Sig Start Date End Date Taking? Authorizing Provider  ADDERALL XR 30 MG 24 hr capsule Take 1 capsule by  mouth daily. 09/08/16  Yes [provider]  ALPRAZolam Prudy Feeler) 1 MG tablet Take 1 mg by mouth as needed. 02/11/20  Yes [provider]  Brexpiprazole (REXULTI PO) Take 4 mg by mouth 1 day or 1 dose.   Yes [provider]  Buprenorphine HCl-Naloxone HCl 12-3 MG FILM Place 8 mg under the tongue in the morning and at bedtime.   Yes [provider]  cholecalciferol (VITAMIN D) 1000 units tablet Take 4,000 Units by mouth daily.   Yes [provider]  clindamycin (CLEOCIN) 300 MG capsule Take 1 capsule (300 mg total) by mouth 3 (three) times daily for 10 days. 11/08/20 11/18/20 Yes Cheron Schaumann K, PA-C  furosemide (LASIX) 40 MG tablet Take 40 mg by mouth daily. 02/11/20  Yes [provider]  HYDROcodone-acetaminophen (NORCO/VICODIN) 5-325 MG tablet Take 1 tablet by mouth every 4 (four) hours as needed for moderate pain. 11/08/20 11/08/21 Yes Elson Areas, PA-C  lamoTRIgine (LAMICTAL) 200 MG tablet Take 1 tablet by mouth daily. 09/08/16  Yes [provider]  levothyroxine (SYNTHROID) 100 MCG tablet Take 100 mcg by mouth daily. 02/19/20  Yes [provider]  acetaminophen (TYLENOL) 500 MG  tablet Take 1,000 mg by mouth every 6 (six) hours as needed for moderate pain.    [provider]  albuterol (VENTOLIN HFA) 108 (90 Base) MCG/ACT inhaler Inhale 2 puffs into the lungs as needed. Patient not taking: No sig reported 02/11/16   [provider]    Allergies    Amoxicillin and Cefaclor  Review of Systems   Review of Systems  All other systems reviewed and are negative.  Physical Exam Updated Vital Signs BP 130/86 (BP Location: Right Arm)   Pulse 90   Temp 98.8 F (37.1 C) (Oral)   Resp 18   Ht 5\' 4"  (1.626 m)   Wt 134.7 kg   SpO2 99%   BMI 50.98 kg/m   Physical Exam Vitals and nursing note reviewed.  Constitutional:      Appearance: She is well-developed.  HENT:     Head: Normocephalic.     Mouth/Throat:     Comments: Dental decay left lower frontal  Pulmonary:     Effort: Pulmonary effort is normal.  Abdominal:     General: There is no distension.  Musculoskeletal:        General: Normal range of motion.     Cervical back: Normal range of motion.  Neurological:     Mental Status: She is alert and oriented to person, place, and time.  Psychiatric:        Mood and Affect: Mood normal.    ED Results / Procedures / Treatments   Labs (all labs ordered are listed, but only abnormal results are displayed) Labs Reviewed - No data to display  EKG None  Radiology No results found.  Procedures Procedures   Medications Ordered in ED Medications - No data to display  ED Course  I have reviewed the triage vital signs and the nursing notes.  Pertinent labs & imaging results that were available during my care of the patient were reviewed by me and considered in my medical decision making (see chart for details).    MDM Rules/Calculators/A&P                           MDM:  Pt advised to follow up with her dentist. Final Clinical Impression(s) / ED Diagnoses Final diagnoses:  Dental infection    Rx / DC Orders ED Discharge  Orders          Ordered    HYDROcodone-acetaminophen (NORCO/VICODIN) 5-325 MG tablet  Every 4 hours PRN        11/08/20 1348    clindamycin (CLEOCIN) 300 MG capsule  3 times daily        11/08/20 1348          An After Visit Summary was printed and given to the patient.    Elson Areas, [place] 11/08/20 1412  Pharmacy called and advised they can not fill hydrocdone due to pt being on suboxone.  Pharmacist concerned about substance abuse.    Elson Areas, [place] 11/08/20 1744    Bethann Berkshire, MD 11/11/20 [phone]

## 2020-11-08 NOTE — Discharge Instructions (Addendum)
See your dentist as scheduled.

## 2020-11-08 NOTE — ED Triage Notes (Signed)
Pt complains of tooth pain on the lower left side that began a couple days ago.

## 2020-11-08 NOTE — ED Notes (Signed)
Had a tooth pulled earlier in week, has f/u Thursday, has been taking clindamycin for previous procedure.

## 2020-12-05 ENCOUNTER — Telehealth: Payer: Self-pay

## 2020-12-05 NOTE — Telephone Encounter (Signed)
   Alyssa Hansen is a 30 y.o. female and has been referred to the pharmacist telephone-based smoking cessation service on 10/20/2020 by pulmonologist Dr. Maple Hudson  -------------------------------------------------------------------------------------------------------------------  Confirmed that patient does not meet any of the following exclusion criteria: No  Pregnancy Schizophrenia, bipolar disorder, or major depression Myocardial infarction or coronary artery bypass grafting in the last 2 months Severe or worsening angina   -------------------------------------------------------------------------------------------------------------------  Patient does not meet inclusion criteria as she endorses having a history of bipolar disorder and major depression. Referred patient to 1-800 QUIT NOW for smoking cessation resources.

## 2021-02-01 DIAGNOSIS — Z720 Tobacco use: Secondary | ICD-10-CM | POA: Insufficient documentation

## 2021-02-01 NOTE — Assessment & Plan Note (Signed)
Many life-style problems. Encouraged to work on [employer] with diet, exercise.

## 2021-02-01 NOTE — Assessment & Plan Note (Signed)
Benefits from CPAP with good compliance and control Plan- continue auto 5-20 

## 2021-02-01 NOTE — Assessment & Plan Note (Signed)
Extensive hx of substance abuse Encouraged her to consider availing herself of tobacco cessation program.

## 2021-02-15 NOTE — Progress Notes (Unsigned)
HPI F Smoker followed for OSA, complicated by  Hypothyroid, PCOS, Hx Opioid use, Hx Cocaine use, ADHD, Anxiety, BiPolar, Hx Suicidal Tendency, Asthma, NPSG 02/24/20 (per Dr Stevphen Rochester Su)  AHI 18.2/ hr, desaturation to 89%, CPAP to 15, body weight 283 lbs  ================================================================================   10/16/20- 30 yoF Smoker followed for OSA, complicated by  Hypothyroid, PCOS, Hx Opioid use, Hx Cocaine use, ADHD, Anxiety, BiPolar, Hx Suicidal Tendency, Asthma, Obesity, Tobacco User,  -meds include synthroid, ventolin hfa, xanax 1 mg, naloxone, lamictal, adderall XR 30 CPAP auto 5-20/ [facility]   ordered 03/19/20 Download-her phone- compliance 96%, AHI 1.1/ hr Body weight today-296 lbs- Covid vax-1 Moderna Flu vax-declines -----Pt states no concerns Download reviewed. Doing well. Denies interval issues or changes.   02/16/21- 30 yoF Smoker followed for OSA, complicated by  Hypothyroid, PCOS, Hx Opioid use, Hx Cocaine use, ADHD, Anxiety, BiPolar, Hx Suicidal Tendency, Asthma, Obesity, Tobacco User,  -meds include synthroid, ventolin hfa, xanax 1 mg, naloxone, lamictal, adderall XR 30 CPAP auto 5-20/ [facility]   ordered 03/19/20 Download- Body weight today- Covid vax- Flu vax-   ROS-see HPI   + = positive Constitutional:    weight loss, night sweats, fevers, chills, +fatigue, lassitude. HEENT:    headaches, difficulty swallowing, tooth/dental problems, sore throat,       sneezing, itching, ear ache, nasal congestion, post nasal drip, snoring CV:    chest pain, orthopnea, PND, swelling in lower extremities, anasarca,                                   dizziness, palpitations Resp:   shortness of breath with exertion or at rest.                productive cough,   non-productive cough, coughing up of blood.              change in color of mucus.  wheezing.   Skin:    rash or lesions. GI:  No-   heartburn, indigestion, abdominal pain,  nausea, vomiting, diarrhea,                 change in bowel habits, loss of appetite GU: dysuria, change in color of urine, no urgency or frequency.   flank pain. MS:   joint pain, stiffness, decreased range of motion, back pain. Neuro-     nothing unusual Psych:  change in mood or affect.  depression or anxiety.   memory loss.  OBJ- Physical Exam General- Alert, Oriented, Affect-appropriate, Distress- none acute, + obese Skin- +tatoo Lymphadenopathy- none Head- atraumatic        + stud upper lip            Eyes- Gross vision intact, PERRLA, conjunctivae and secretions clear            Ears- Hearing, canals-normal            Nose- Clear, no-Septal dev, mucus, polyps, erosion, perforation             Throat- Mallampati IV , mucosa clear , drainage- none, tonsils+, + few missing teeth Neck- flexible , trachea midline, no stridor , thyroid nl, carotid no bruit Chest - symmetrical excursion , unlabored           Heart/CV- RRR , no murmur , no gallop  , no rub, nl s1 s2                           -  JVD- none , edema- none, stasis changes- none, varices- none           Lung- clear to P&A, wheeze- none, cough- none , dullness-none, rub- none           Chest wall-  Abd-  Br/ Gen/ Rectal- Not done, not indicated Extrem- cyanosis- none, clubbing, none, atrophy- none, strength- nl Neuro- grossly intact to observation

## 2021-02-16 ENCOUNTER — Ambulatory Visit: Payer: Medicaid Other | Admitting: Internal Medicine

## 2021-05-20 ENCOUNTER — Telehealth: Payer: Self-pay | Admitting: Internal Medicine

## 2021-05-20 DIAGNOSIS — G4733 Obstructive sleep apnea (adult) (pediatric): Secondary | ICD-10-CM

## 2021-05-20 NOTE — Telephone Encounter (Signed)
Called and spoke with patient who states that she needs order for new Cpap mask sent over to [facility]. DME order has been placed. Nothing further needed at this time. ?

## 2022-01-19 ENCOUNTER — Encounter: Payer: Self-pay | Admitting: Physical Medicine & Rehabilitation

## 2022-02-01 ENCOUNTER — Encounter: Payer: Medicaid Other | Admitting: Physical Medicine & Rehabilitation

## 2022-02-26 NOTE — Progress Notes (Deleted)
Subjective:    Patient ID: Alyssa Hansen, female    DOB: 09/27/1990, 32 y.o.   MRN: [id number]  HPI   Pain Inventory Average Pain {NUMBERS; 0-10:5044} Pain Right Now {NUMBERS; 0-10:5044} My pain is {PAIN DESCRIPTION:21022940}  In the last 24 hours, has pain interfered with the following? General activity {NUMBERS; 0-10:5044} Relation with others {NUMBERS; 0-10:5044} Enjoyment of life {NUMBERS; 0-10:5044} What TIME of day is your pain at its worst? {time of day:24191} Sleep (in general) {BHH GOOD/FAIR/POOR:22877}  Pain is worse with: {ACTIVITIES:21022942} Pain improves with: {PAIN IMPROVES BW:[id number] Relief from Meds: {NUMBERS; 0-10:5044}  Family History  Problem Relation Age of Onset   Hypertension Father    Hypertension Maternal Grandmother    Hypertension Maternal Grandfather    Heart disease Maternal Grandfather    Diabetes Maternal Grandfather    Alcohol abuse Neg Hx    Arthritis Neg Hx    Asthma Neg Hx    Birth defects Neg Hx    Cancer Neg Hx    Depression Neg Hx    COPD Neg Hx    Drug abuse Neg Hx    Early death Neg Hx    Hearing loss Neg Hx    Social History   Socioeconomic History   Marital status: Single    Spouse name: Not on file   Number of children: Not on file   Years of education: Not on file   Highest education level: Not on file  Occupational History   Not on file  Tobacco Use   Smoking status: Every Day    Packs/day: 1.00    Types: Cigarettes   Smokeless tobacco: Never   Tobacco comments:    1/2 pack a day  Vaping Use   Vaping Use: Never used  Substance and Sexual Activity   Alcohol use: No   Drug use: Yes    Types: Oxycodone, Cocaine, Marijuana    Comment: denies 11/08/20   Sexual activity: Not on file  Other Topics Concern   Not on file  Social History Narrative   Not on file   Social Determinants of Health   Financial Resource Strain: Not on file  Food Insecurity: Not on file  Transportation Needs: Not on file   Physical Activity: Not on file  Stress: Not on file  Social Connections: Not on file   Past Surgical History:  Procedure Laterality Date   CESAREAN SECTION     LAPAROSCOPIC ABDOMINAL EXPLORATION     Past Surgical History:  Procedure Laterality Date   CESAREAN SECTION     LAPAROSCOPIC ABDOMINAL EXPLORATION     Past Medical History:  Diagnosis Date   ADHD (attention deficit hyperactivity disorder)    ADHD (attention deficit hyperactivity disorder)    ADHD (attention deficit hyperactivity disorder)    Anxiety    Bipolar 1 disorder, depressed (HCC)    Chlamydia    Depression    Drug abuse ([place])    Herpes    + blood test, never had outbreak   History of suicidal tendencies    Hypothyroidism    No meds.   Obesity    PCOS (polycystic ovarian syndrome)    Restless legs    Scoliosis    Substance abuse (HCC)    Trichomonas    There were no vitals taken for this visit.  Opioid Risk Score:   Fall Risk Score:  `1  Depression screen PHQ 2/9      No data to display  Review of Systems     Objective:   Physical Exam        Assessment & Plan:

## 2022-02-26 NOTE — Progress Notes (Unsigned)
Subjective:    Patient ID: Alyssa Hansen, female    DOB: May 19, 1990, 32 y.o.   MRN: [id number]  HPI   Pain Inventory Average Pain {NUMBERS; 0-10:5044} Pain Right Now {NUMBERS; 0-10:5044} My pain is {PAIN DESCRIPTION:21022940}  In the last 24 hours, has pain interfered with the following? General activity {NUMBERS; 0-10:5044} Relation with others {NUMBERS; 0-10:5044} Enjoyment of life {NUMBERS; 0-10:5044} What TIME of day is your pain at its worst? {time of day:24191} Sleep (in general) {BHH GOOD/FAIR/POOR:22877}  Pain is worse with: {ACTIVITIES:21022942} Pain improves with: {PAIN IMPROVES SV:5789238 Relief from Meds: {NUMBERS; 0-10:5044}  {MOBILITY IM:[id number]  {FUNCTION:21022946}  {NEURO/PSYCH:21022948}  {CPRM PRIOR STUDIES:21022953}  {CPRM PHYSICIANS INVOLVED IN YOUR CARE:21022954}    Family History  Problem Relation Age of Onset   Hypertension Father    Hypertension Maternal Grandmother    Hypertension Maternal Grandfather    Heart disease Maternal Grandfather    Diabetes Maternal Grandfather    Alcohol abuse Neg Hx    Arthritis Neg Hx    Asthma Neg Hx    Birth defects Neg Hx    Cancer Neg Hx    Depression Neg Hx    COPD Neg Hx    Drug abuse Neg Hx    Early death Neg Hx    Hearing loss Neg Hx    Social History   Socioeconomic History   Marital status: Single    Spouse name: Not on file   Number of children: Not on file   Years of education: Not on file   Highest education level: Not on file  Occupational History   Not on file  Tobacco Use   Smoking status: Every Day    Packs/day: 1.00    Types: Cigarettes   Smokeless tobacco: Never   Tobacco comments:    1/2 pack a day  Vaping Use   Vaping Use: Never used  Substance and Sexual Activity   Alcohol use: No   Drug use: Yes    Types: Oxycodone, Cocaine, Marijuana    Comment: denies 11/08/20   Sexual activity: Not on file  Other Topics Concern   Not on file  Social History Narrative    Not on file   Social Determinants of Health   Financial Resource Strain: Not on file  Food Insecurity: Not on file  Transportation Needs: Not on file  Physical Activity: Not on file  Stress: Not on file  Social Connections: Not on file   Past Surgical History:  Procedure Laterality Date   CESAREAN SECTION     LAPAROSCOPIC ABDOMINAL EXPLORATION     Past Medical History:  Diagnosis Date   ADHD (attention deficit hyperactivity disorder)    ADHD (attention deficit hyperactivity disorder)    ADHD (attention deficit hyperactivity disorder)    Anxiety    Bipolar 1 disorder, depressed (HCC)    Chlamydia    Depression    Drug abuse ([place])    Herpes    + blood test, never had outbreak   History of suicidal tendencies    Hypothyroidism    No meds.   Obesity    PCOS (polycystic ovarian syndrome)    Restless legs    Scoliosis    Substance abuse (HCC)    Trichomonas    There were no vitals taken for this visit.  Opioid Risk Score:   Fall Risk Score:  `1  Depression screen PHQ 2/9      No data to display  Review of Systems     Objective:   Physical Exam        Assessment & Plan:

## 2022-03-01 ENCOUNTER — Encounter: Payer: Medicaid Other | Attending: Physical Medicine & Rehabilitation | Admitting: Physical Medicine and Rehabilitation

## 2022-03-01 ENCOUNTER — Encounter: Payer: Self-pay | Admitting: Physical Medicine and Rehabilitation

## 2022-03-01 VITALS — BP 140/81 | HR 105 | Ht 64.0 in | Wt 300.8 lb

## 2022-03-01 DIAGNOSIS — G8929 Other chronic pain: Secondary | ICD-10-CM | POA: Insufficient documentation

## 2022-03-01 DIAGNOSIS — F1120 Opioid dependence, uncomplicated: Secondary | ICD-10-CM | POA: Insufficient documentation

## 2022-03-01 DIAGNOSIS — Z720 Tobacco use: Secondary | ICD-10-CM

## 2022-03-01 DIAGNOSIS — G894 Chronic pain syndrome: Secondary | ICD-10-CM | POA: Insufficient documentation

## 2022-03-01 DIAGNOSIS — G2581 Restless legs syndrome: Secondary | ICD-10-CM | POA: Diagnosis present

## 2022-03-01 DIAGNOSIS — M5442 Lumbago with sciatica, left side: Secondary | ICD-10-CM | POA: Diagnosis not present

## 2022-03-01 DIAGNOSIS — G5601 Carpal tunnel syndrome, right upper limb: Secondary | ICD-10-CM

## 2022-03-01 NOTE — Patient Instructions (Addendum)
Today, you signed a pain contract with our clinic.  I will take over prescribing her oxycodone at the next refill due, in 1 month quantities for now.  I will calculate a taper from gabapentin to Lyrica, and send that to your pharmacy tomorrow.  Message me through [place] or call me in 1 to 2 weeks to let me know if you are having any side effects from this medication.  I have referred you to aqua therapy for gentle mobilization and some endurance improvement.  I have also referred you to a nutritionist to help with weight loss recommendations, injunction with your current weight loss medication management.  Wear a neutral wrist splint on your right wrist for 2 consecutive weeks minimum for carpal tunnel syndrome. Here is an example of a good splint:   Follow-up with me in 1 month to see how things are going.  At that visit, we may perform trigger point injections into the muscles of your shoulders and low back.  We also may discuss referral for steroid injections into the left SI joint, however we will hold off for now because you recently received steroids.  Will perform a urine drug screen at follow-up

## 2022-03-02 ENCOUNTER — Encounter: Payer: Self-pay | Admitting: Physical Medicine and Rehabilitation

## 2022-03-02 DIAGNOSIS — G894 Chronic pain syndrome: Secondary | ICD-10-CM

## 2022-03-02 DIAGNOSIS — G8929 Other chronic pain: Secondary | ICD-10-CM

## 2022-03-02 MED ORDER — PREGABALIN 75 MG PO CAPS: ORAL_CAPSULE | ORAL | 2 refills | Status: DC

## 2022-03-04 DIAGNOSIS — G5601 Carpal tunnel syndrome, right upper limb: Secondary | ICD-10-CM | POA: Insufficient documentation

## 2022-03-04 DIAGNOSIS — G2581 Restless legs syndrome: Secondary | ICD-10-CM | POA: Insufficient documentation

## 2022-03-04 MED ORDER — OXYCODONE HCL 10 MG PO TABS: 10.0000 mg | ORAL_TABLET | ORAL | 0 refills | Status: AC | PRN

## 2022-03-04 NOTE — Assessment & Plan Note (Addendum)
Today, you signed a pain contract with our clinic.  I will take over prescribing her oxycodone at the next refill due, in 1 month quantities for now.  I will calculate a taper from gabapentin to Lyrica, and send that to your pharmacy tomorrow.  Message me through [place] or call me in 1 to 2 weeks to let me know if you are having any side effects from this medication.  At that visit, we may perform trigger point injections into the muscles of your shoulders and low back.    Follow-up with me in 1 month to see how things are going.    We also may discuss referral for steroid injections into the left SI joint, however we will hold off for now because you recently received steroids.  Will perform a urine drug screen at follow-up

## 2022-03-04 NOTE — Assessment & Plan Note (Signed)
Discussed with patient will continue current medication regimen for now, goal is with adjunctive medications, therapies, and interventions to wean opiates to lowest tolerable dose.  Advised her to bring all excess medications to her next appointment for disposal, including suboxone; especially important given reported family members stealing medication.

## 2022-03-04 NOTE — Assessment & Plan Note (Addendum)
Wear a neutral wrist splint on your right wrist for 2 consecutive weeks minimum for carpal tunnel syndrome. Here is an example of a good splint:

## 2022-03-04 NOTE — Assessment & Plan Note (Addendum)
Indication for chronic opioid: Chronic back pain Medication and dose: Oxycodone 10 mg Q4H PRN # pills per month: 180 Last UDS date: Next visit due to taking suboxone recently, unaware of need for UDS with contract Opioid Treatment Agreement signed (Y/N): y Opioid Treatment Agreement last reviewed with patient:   NCCSRS/PDMP reviewed this encounter (include red flags): Yes   Management will include: Medium Risk (10-90 MME) UDS every 3-6 months, + random PRN due to Hx recreational drug use NCCSR check every visit Follow up Q1M initially, Q3M once established

## 2022-03-04 NOTE — Assessment & Plan Note (Signed)
I have referred you to aqua therapy for gentle mobilization and some endurance improvement.  I have also referred you to a nutritionist to help with weight loss recommendations, injunction with your current weight loss medication management.

## 2022-03-04 NOTE — Assessment & Plan Note (Signed)
Supplement with 40 mg vitamin B6 and  250 mg magnesium daily.  Per last CBC, HgB WNL; will defer iron studies for now

## 2022-03-16 ENCOUNTER — Encounter: Payer: Self-pay | Admitting: Physical Medicine and Rehabilitation

## 2022-03-17 ENCOUNTER — Ambulatory Visit: Payer: Medicaid Other | Attending: Physical Medicine and Rehabilitation | Admitting: Physical Therapy

## 2022-03-17 ENCOUNTER — Encounter: Payer: Self-pay | Admitting: Physical Therapy

## 2022-03-17 ENCOUNTER — Other Ambulatory Visit: Payer: Self-pay

## 2022-03-17 DIAGNOSIS — M6281 Muscle weakness (generalized): Secondary | ICD-10-CM | POA: Diagnosis not present

## 2022-03-17 DIAGNOSIS — M5442 Lumbago with sciatica, left side: Secondary | ICD-10-CM | POA: Diagnosis not present

## 2022-03-17 DIAGNOSIS — R2689 Other abnormalities of gait and mobility: Secondary | ICD-10-CM | POA: Insufficient documentation

## 2022-03-17 DIAGNOSIS — R293 Abnormal posture: Secondary | ICD-10-CM | POA: Diagnosis not present

## 2022-03-17 DIAGNOSIS — G8929 Other chronic pain: Secondary | ICD-10-CM | POA: Diagnosis not present

## 2022-03-17 NOTE — Patient Instructions (Signed)
     [facility] Welcome to [facility]! Here you will find all the information you will need regarding your pool therapy. If you have further questions at any time, please call our office at [phone]. After completing your initial evaluation in the [facility], you may be eligible to complete a portion of your therapy in the pool. A typical week of therapy will consist of 1-2 typical physical therapy visits at our [facility] location and an additional session of therapy in the pool located at the [facility] at [address]. [address], [place] [address]. The phone number at the pool site is [phone]. Please call this number if you are running late or need to cancel your appointment.  Aquatic therapy will be offered on Wednesday mornings and Friday afternoons. Each session will last approximately 45 minutes. All scheduling and payments for aquatic therapy sessions, including cancelations, will be done through our [facility] location.  To be eligible for aquatic therapy, these criteria must be met: You must be able to independently change in the locker room and get to the pool deck. A caregiver can come with you to help if needed. There are benches for a caregiver to sit on next to the pool. No one with an open wound is permitted in the pool.  Handicap parking is available in the front and there is a drop off option for even closer accessibility. Please arrive 15 minutes prior to your appointment to prepare for your pool session. You must sign in at the front desk upon your arrival. Please be sure to attend to any toileting needs prior to entering the pool. Locker rooms for changing are available.  There is direct access to the pool deck from the locker room. You can lock your belongings in a locker or bring them with you poolside. Your therapist will greet you on the pool deck. There may be other swimmers in the pool at the  same time but your session is one-on-one with the therapist.   

## 2022-03-17 NOTE — Therapy (Signed)
OUTPATIENT PHYSICAL THERAPY THORACOLUMBAR EVALUATION   Patient Name: Alyssa Hansen MRN: [id number] DOB:Apr 04, 1990, 32 y.o., female Today's Date: 03/17/2022  END OF SESSION:  PT End of Session - 03/17/22 1334     Visit Number 1    Date for PT Re-Evaluation 05/12/22    Authorization Type [employer] - requesting 10 visits 1-2x/week from 03/19/22 to 05/12/22    PT Start Time 1100    PT Stop Time 1140    PT Time Calculation (min) 40 min    Activity Tolerance Patient limited by pain    Behavior During Therapy [facility] for tasks assessed/performed             Past Medical History:  Diagnosis Date   ADHD (attention deficit hyperactivity disorder)    ADHD (attention deficit hyperactivity disorder)    ADHD (attention deficit hyperactivity disorder)    Anxiety    Bipolar 1 disorder, depressed ([place])    Chlamydia    Depression    Drug abuse ([place])    Herpes    + blood test, never had outbreak   History of suicidal tendencies    Hypothyroidism    No meds.   Obesity    PCOS (polycystic ovarian syndrome)    Restless legs    Scoliosis    Substance abuse ([place])    Trichomonas    Past Surgical History:  Procedure Laterality Date   CESAREAN SECTION     LAPAROSCOPIC ABDOMINAL EXPLORATION     Patient Active Problem List   Diagnosis Date Noted   Right carpal tunnel syndrome 03/04/2022   Restless legs 03/04/2022   Chronic pain syndrome 03/01/2022   Morbid obesity due to excess calories ([place]) 02/01/2021   Tobacco user 02/01/2021   OSA (obstructive sleep apnea) 03/19/2020   Opioid dependence ([place]) 04/15/2012   Cocaine abuse ([place]) 04/15/2012   Opioid use with withdrawal ([place]) 04/15/2012   Substance induced mood disorder ([place]) 04/15/2012   LOW BACK PAIN 08/02/2007    PCP: no PCP  REFERRING PROVIDER: Gertie Gowda, DO  REFERRING DIAG: G110.4 (ICD-10-CM) - Chronic pain syndrome M54.42,G89.29 (ICD-10-CM) - Chronic bilateral low back pain with left-sided sciatica  Rationale for  Evaluation and Treatment: Rehabilitation  THERAPY DIAG:  Chronic left-sided low back pain with left-sided sciatica - Plan: PT plan of care cert/re-cert  Abnormal posture - Plan: PT plan of care cert/re-cert  Other abnormalities of gait and mobility - Plan: PT plan of care cert/re-cert  Muscle weakness (generalized) - Plan: PT plan of care cert/re-cert  ONSET DATE: chronic  SUBJECTIVE:  SUBJECTIVE STATEMENT: Pt referred specifically for aquatic PT for chronic pain syndrome and LBP with sciatica into bil LE Lt>Rt.  Pain has been present since age 34.  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   PERTINENT HISTORY:  Per chart review, note from referring MD: ADHD, Anxiety, Bipolar 1 disorder, depressed ([place]), Depression, Drug abuse ([place]), History of suicidal tendencies, Hypothyroidism, Obesity, PCOS (polycystic ovarian syndrome), Restless legs, Scoliosis, Substance abuse ([place]), and Trichomonas.     PAIN:  PAIN:  Are you having pain? Yes NPRS scale: 5-9/10 Pain location: LBP central and spreads bil, Lt>Rt throbbing and sharp shooting pains Pain orientation: Bilateral  PAIN TYPE: aching, sharp, and throbbing Pain description: constant  Aggravating factors: standing and walking, all physical movement Relieving factors: oxycodone and laying down, ice, TENS unit   PRECAUTIONS: None  WEIGHT BEARING RESTRICTIONS: No  FALLS:  Has patient fallen in last 6 months? Yes. Number of falls 1 in Oct, climbing on ladder, fell 3 feet onto concrete  LIVING ENVIRONMENT: Lives with: lives with their family - husband and 3 kids ages 49-14 Lives in: House/apartment Stairs: Yes: External: 4 steps; none Has following equipment at home: Single point cane, shower bench  OCCUPATION: stay at home mom  PLOF:  Independent  PATIENT GOALS: try aquatic PT b/c land-based PT has worsened pain in past  NEXT MD VISIT: April 1  OBJECTIVE:   DIAGNOSTIC FINDINGS:  Intact bone structure low back per Pt - has had updated MRI and CT scan but not in chart since 2018  PATIENT SURVEYS:  Modified Oswestry 32/50   SCREENING FOR RED FLAGS: Bowel or bladder incontinence: No Spinal tumors: No Cauda equina syndrome: No Compression fracture: No Abdominal aneurysm: No  COGNITION: Overall cognitive status: Within functional limits for tasks assessed     SENSATION: Both legs go numb with sitting > 20-30 min  MUSCLE LENGTH: Hamstrings: 50% bil Thomas test: 50% bil  POSTURE: rounded shoulders, forward head, increased lumbar lordosis, increased thoracic kyphosis, and anterior pelvic tilt  PALPATION: Global tenderness, tight QL and lumbar paraspinals bil, tight upper traps and pecs  CERVICAL ROM: Starts from signif forward head with extension hinge Bil rotation 60 deg with contralateral pain Flexion 40 deg Extension 12 deg  LUMBAR ROM:   AROM eval  Flexion Fingers to midshin, if bends knees gets relief  Extension Not tested  Right lateral flexion 75%  Left lateral flexion 60%  Right rotation 30%  Left rotation 30%   (Blank rows = not tested)  LOWER EXTREMITY ROM:     WFL  LOWER EXTREMITY MMT:   Bil shoulders 4-/5 Bil hips 4-/5 Bil knees 4/5   FUNCTIONAL TESTS:  5 times sit to stand: 22.45 sec Timed up and go (TUG): 14.23 sec 3 minute walk test: 398 feet, pain up to 7/10, SOB  GAIT: Distance walked: 398 Assistive device utilized: None Level of assistance: Complete Independence Comments: short stride length, lacks trunk rotation and arm swing  TODAY'S TREATMENT:  DATE: 03/17/22  Discussed POC - MD wants aquatic PT, will reassess at 10th visit to see if can  transition to land or hybrid of land/aquatic PT   PATIENT EDUCATION:  Education details: aquatic info Person educated: Patient Education method: Theatre stage manager Education comprehension: verbalized understanding  HOME EXERCISE PROGRAM: Will do aquatic PT 2x/week then reassess for readiness of HEP  ASSESSMENT:  CLINICAL IMPRESSION: Patient is a 32 y.o. female who was seen today for physical therapy evaluation and treatment for chronic pain syndrome and chronic LBP with bil sciatica Lt>Rt.  Pt is specifically referred to try aquatic PT b/c land-based PT has worsened her symptoms.  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  She has constant pain made worse by all physical movement.  Pain has been present since age 50 and worsened over time.  Pain ranges from 5-9/10.  She reports bil leg numbness with sitting 20-30 min and sharp shooting pains into legs with standing and walking.  She uses a walking stick when running errands.  She has limited mobility and ROM throughout trunk and proximal joints.  Body habitus contributes to ROM limitations.  She has 4/5 strength throughout UE/LE with pain on testing.  She was able to participate in 3MWT today covering 398' without AD but pain spiked to 7/10 with this.  She was tearful throughout session secondary to pain with physical testing.  ODI score is 32/50 falling in severe disability category.  Pt will benefit from aquatic environment to initiate mobility, stretching, strengthening and endurance given high pain levels and limited tolerance of land-based movement.  OBJECTIVE IMPAIRMENTS: Abnormal gait, decreased activity tolerance, decreased balance, decreased mobility, decreased ROM, decreased strength, hypomobility, increased fascial restrictions, increased muscle spasms, impaired flexibility, impaired sensation, impaired tone, impaired UE functional use, improper body mechanics, postural dysfunction, obesity, and pain.   ACTIVITY  LIMITATIONS: carrying, lifting, bending, standing, squatting, sleeping, stairs, transfers, bed mobility, bathing, dressing, reach over head, and locomotion level  PARTICIPATION LIMITATIONS: meal prep, cleaning, laundry, shopping, community activity, and yard work  PERSONAL FACTORS: Time since onset of injury/illness/exacerbation and 1-2 comorbidities: obesity,   are also affecting patient's functional outcome.   REHAB POTENTIAL: Good  CLINICAL DECISION MAKING: Stable/uncomplicated  EVALUATION COMPLEXITY: Low   GOALS: Goals reviewed with patient? Yes  SHORT TERM GOALS: Target date: 04/14/22  Pt will be able to participate in aquatic PT for at least 25 min of active mobility of trunk, LE, UE without exacerbation of pain Baseline: Goal status: INITIAL  2.  Pt will be able to perform a 6MWT in the pool without exacerbation of pain Baseline:  Goal status: INITIAL  3.  Pt will learn proper activations of deep core, scapular stabilizers, postural support muscles and LE prime movers in an aquatic environment to build strength and promote ROM with improved alignment. Baseline:  Goal status: INITIAL    LONG TERM GOALS: Target date: 05/12/22  Pt will improve 3MWT to cover at least 450' with pain not to exceed 6/10 Baseline:  Goal status: INITIAL  2.  Pt will explore option to include LRAD to improve land-based tolerance of walking for exercise and community outings/errands Baseline:  Goal status: INITIAL  3.  Pt will improve ODI score by at least 8 points to improve disability level from severe to moderate. Baseline: 32/50 Goal status: INITIAL  4.  Pt will improve UE and LE strength to at least 4+/5 to improve tolerance of daily household tasks. Baseline:  Goal status: INITIAL  5.  Pt will report improved tolerance of standing tasks for up to 20 min for light household tasks. Baseline:  Goal status: INITIAL  6.  Pt will learn land-based stretches and strengthening exercises  she can perform for HEP after a round of aquatic PT. Baseline:  Goal status: INITIAL  PLAN:  PT FREQUENCY:1-2x/week for 9 aquatic visits and 10th visit land-based re-evaluation  PT DURATION: 8 weeks  PLANNED INTERVENTIONS: Therapeutic exercises, Therapeutic activity, Neuromuscular re-education, Balance training, Gait training, Patient/Family education, Self Care, and Aquatic Therapy.  PLAN FOR NEXT SESSION: start aquatic PT - work on general mobility of trunk/UE/LE, postural strength, gait endurance for chronic pain and deconditioning   Ladarrian Asencio, PT 03/17/22 1:37 PM

## 2022-03-17 NOTE — Telephone Encounter (Signed)
PA Submitted for Oxycodone Reinette Mercier (Key: [id number])

## 2022-03-31 NOTE — Progress Notes (Unsigned)
Subjective:    Patient ID: Alyssa Hansen, female    DOB: 08-19-1990, 32 y.o.   MRN: [id number]  HPI  Alyssa Hansen is a 32 y.o. year old female  who  has a past medical history of ADHD (attention deficit hyperactivity disorder), ADHD (attention deficit hyperactivity disorder), ADHD (attention deficit hyperactivity disorder), Anxiety, Bipolar 1 disorder, depressed, Chlamydia, Depression, Drug abuse, Herpes, History of suicidal tendencies, Hypothyroidism, Obesity, PCOS (polycystic ovarian syndrome), Restless legs, Scoliosis, Substance abuse, and Trichomonas.   They are presenting to PM&R clinic for follow up related to lumbosacral back pain .  Plan from last visit: Chronic pain syndrome Assessment & Plan: Indication for chronic opioid: Chronic back pain Medication and dose: Oxycodone 10 mg Q4H PRN # pills per month: 180 Last UDS date: Next visit due to taking suboxone recently, unaware of need for UDS with contract Opioid Treatment Agreement signed (Y/N): y Opioid Treatment Agreement last reviewed with patient:   NCCSRS/PDMP reviewed this encounter (include red flags): Yes    Management will include: Medium Risk (10-90 MME) UDS every 3-6 months, + random PRN due to Hx recreational drug use NCCSR check every visit Follow up Q1M initially, Q3M once established       Orders: -     Ambulatory referral to Physical Therapy -     Amb ref to Medical Nutrition Therapy-MNT   Chronic bilateral low back pain with left-sided sciatica Assessment & Plan: Today, you signed a pain contract with our clinic.  I will take over prescribing her oxycodone at the next refill due, in 1 month quantities for now.   I will calculate a taper from gabapentin to Lyrica, and send that to your pharmacy tomorrow.  Message me through [facility] or call me in 1 to 2 weeks to let me know if you are having any side effects from this medication.   At that visit, we may perform trigger point injections into the muscles  of your shoulders and low back.     Follow-up with me in 1 month to see how things are going.     We also may discuss referral for steroid injections into the left SI joint, however we will hold off for now because you recently received steroids.   Will perform a urine drug screen at follow-up   Orders: -     Ambulatory referral to Physical Therapy   Morbid obesity due to excess calories [facility]) Assessment & Plan: I have referred you to aqua therapy for gentle mobilization and some endurance improvement.  I have also referred you to a nutritionist to help with weight loss recommendations, injunction with your current weight loss medication management.   Orders: -     Amb ref to Medical Nutrition Therapy-MNT   Uncomplicated opioid dependence ([place]) Assessment & Plan: Discussed with patient will continue current medication regimen for now, goal is with adjunctive medications, therapies, and interventions to wean opiates to lowest tolerable dose.   Advised her to bring all excess medications to her next appointment for disposal, including suboxone; especially important given reported family members stealing medication.      Right carpal tunnel syndrome Assessment & Plan: Wear a neutral wrist splint on your right wrist for 2 consecutive weeks minimum for carpal tunnel syndrome.        Restless legs Assessment & Plan: Supplement with 40 mg vitamin B6 and  250 mg magnesium daily.   Per last CBC, HgB WNL; will defer iron studies for now  Interval Hx:  - Therapies: Startes aquatherapy next week.   - Follow ups: none  - Falls:none  - [id number] tunnel braces obtained; states it was initially worse, but now seems to be doing a bit better.    - Medications: Last filled Oxy 03/18/22. Says she did have suboxone for awhile but it was no longer touching her pain, which is why she stopped. Did take Methadone a long time ago; wasn't able to keep up with going to clinic everyday.   She  does feel that her legs feel better with the Lyrica during; denies side effects.  Has noticed some increased gastric reflux recently. Has been present for about a year; does not take anything for it. Notices it after meals.  Needs lidocaine patches refilled for low back  - Other concerns: Vitamins ar helping with RLS.   Asking for referral to Dr. Letta Pate for injections  Did have labs this year with PCP; only abnormality was elevated LFTs. Just had bioolsy showing fatty liver.   Brother no longer allowed in her house; she has been very careful to lock up her medications. Brought them with her today.   Pain Inventory Average Pain 6 Pain Right Now 6 My pain is constant, sharp, burning, stabbing, tingling, and aching  In the last 24 hours, has pain interfered with the following? General activity 9 Relation with others 9 Enjoyment of life 9 What TIME of day is your pain at its worst? daytime and evening Sleep (in general) Fair  Pain is worse with: walking, bending, sitting, inactivity, standing, and some activites Pain improves with: rest, heat/ice, medication, and TENS Relief from Meds: 4  Family History  Problem Relation Age of Onset   Hypertension Father    Hypertension Maternal Grandmother    Hypertension Maternal Grandfather    Heart disease Maternal Grandfather    Diabetes Maternal Grandfather    Alcohol abuse Neg Hx    Arthritis Neg Hx    Asthma Neg Hx    Birth defects Neg Hx    Cancer Neg Hx    Depression Neg Hx    COPD Neg Hx    Drug abuse Neg Hx    Early death Neg Hx    Hearing loss Neg Hx    Social History   Socioeconomic History   Marital status: Single    Spouse name: Not on file   Number of children: Not on file   Years of education: Not on file   Highest education level: Not on file  Occupational History   Not on file  Tobacco Use   Smoking status: Every Day    Packs/day: 1    Types: Cigarettes   Smokeless tobacco: Never   Tobacco comments:     1/2 pack a day  Vaping Use   Vaping Use: Former  Substance and Sexual Activity   Alcohol use: No   Drug use: Yes    Types: Oxycodone, Cocaine, Marijuana    Comment: denies 11/08/20   Sexual activity: Not on file  Other Topics Concern   Not on file  Social History Narrative   Not on file   Social Determinants of Health   Financial Resource Strain: Not on file  Food Insecurity: Not on file  Transportation Needs: Not on file  Physical Activity: Not on file  Stress: Not on file  Social Connections: Not on file   Past Surgical History:  Procedure Laterality Date   CESAREAN SECTION     LAPAROSCOPIC ABDOMINAL EXPLORATION  Past Surgical History:  Procedure Laterality Date   CESAREAN SECTION     LAPAROSCOPIC ABDOMINAL EXPLORATION     Past Medical History:  Diagnosis Date   ADHD (attention deficit hyperactivity disorder)    ADHD (attention deficit hyperactivity disorder)    ADHD (attention deficit hyperactivity disorder)    Anxiety    Bipolar 1 disorder, depressed (HCC)    Chlamydia    Depression    Drug abuse ([place])    Herpes    + blood test, never had outbreak   History of suicidal tendencies    Hypothyroidism    No meds.   Obesity    PCOS (polycystic ovarian syndrome)    Restless legs    Scoliosis    Substance abuse (HCC)    Trichomonas    There were no vitals taken for this visit.  Opioid Risk Score:   Fall Risk Score:  `1  Depression screen [facility] 2/9     03/01/2022   10:43 AM  Depression screen PHQ 2/9  Decreased Interest 3  Down, Depressed, Hopeless 3  PHQ - 2 Score 6  Altered sleeping 2  Tired, decreased energy 3  Change in appetite 3  Feeling bad or failure about yourself  2  Trouble concentrating 1  Moving slowly or fidgety/restless 0  Suicidal thoughts 1  PHQ-9 Score 18    Review of Systems  Constitutional: Negative.   HENT: Negative.    Eyes: Negative.   Respiratory: Negative.    Cardiovascular: Negative.   Gastrointestinal: Negative.    Endocrine: Negative.   Genitourinary: Negative.   Musculoskeletal:  Positive for back pain.  Skin: Negative.   Allergic/Immunologic: Negative.   Neurological: Negative.   Hematological: Negative.   Psychiatric/Behavioral: Negative.    All other systems reviewed and are negative.      Objective:   Physical Exam   PE: Constitution: Appropriate appearance for age. No apparent distress. +Obese Resp: No respiratory distress. No accessory muscle usage. on RA Cardio: Well perfused appearance. No peripheral edema. Abdomen: Nondistended. Nontender.   Psych: Appropriate mood and affect. Neuro: AAOx4. No apparent cognitive deficits  MSK: + TTP bilateral lumbar paraspinals, latissimus dorsi  Neurologic Exam:   Sensory exam: revealed normal sensation in all dermatomal regions in bilateral upper extremities and bilateral lower extremities Motor exam: strength 5/5 throughout bilateral upper extremities and bilateral lower extremities Coordination: Fine motor coordination was normal.   Gait: normal       Assessment & Plan:   Alyssa Hansen is a 32 y.o. year old female  who  has a past medical history of ADHD (attention deficit hyperactivity disorder), ADHD (attention deficit hyperactivity disorder), ADHD (attention deficit hyperactivity disorder), Anxiety, Bipolar 1 disorder, depressed, Chlamydia, Depression, Drug abuse, Herpes, History of suicidal tendencies, Hypothyroidism, Obesity, PCOS (polycystic ovarian syndrome), Restless legs, Scoliosis, Substance abuse, and Trichomonas.   They are presenting to PM&R clinic for chronic pain related to lumbosacral back pain . Chronic pain syndrome Assessment & Plan: Today, we performed a urine drug screen and signed a controlled substance contract.  If results are as expected, I will refill your oxycodone for 3 months.  In the future, we can talk about transitioning to methadone, but I will not increase the dose of oxycodone at this  time.  Indication for chronic opioid: Low Back pain Medication and dose: Oxycodone 10 mg Q4H PRN # pills per month: 180 Last UDS date: 04/05/22 Opioid Treatment Agreement signed (Y/N): Y, 03/04/22 Opioid Treatment Agreement last reviewed with  patient:   NCCSRS/PDMP reviewed this encounter (include red flags): Yes   Management will include: Medium Risk (10-90 MME) UDS every 3-6 months NCCSR check every visit Follow up Q1M initially, Q3M once established   Orders: -     ToxAssure Select,+Antidepr,UR  Encounter for therapeutic drug monitoring -     Photographer for long-term (current) use of high-risk medication -     ToxAssure Select,+Antidepr,UR  Right carpal tunnel syndrome Assessment & Plan: Continue nighttime braces for at least 6 weeks; patient states she is seeing benefit Lyrica increased    Midline low back pain with sciatica, sciatica laterality unspecified, unspecified chronicity Assessment & Plan: We did increase your dose of Lyrica today to 100 mg twice a day.  I refilled your lidocaine patches  Please start your aqua therapy.  I will follow-up with you in 2 months.  I will also get you on the schedule for possible epidural steroid injections with Dr. Letta Pate in 3 months, and if it 62-month follow-up things or not improving, we can get imaging of your back in anticipation of that appointment.  Please call the clinic if any questions, concerns.   Other orders -     Pregabalin; Take 1 capsule (100 mg total) by mouth 2 (two) times daily.  Dispense: 60 capsule; Refill: 3 -     Lidocaine; Place 2 patches onto the skin daily.  Dispense: 30 patch; Refill: 5

## 2022-04-05 ENCOUNTER — Encounter: Payer: Self-pay | Admitting: Physical Medicine and Rehabilitation

## 2022-04-05 ENCOUNTER — Encounter: Payer: Medicaid Other | Attending: Physical Medicine & Rehabilitation | Admitting: Physical Medicine and Rehabilitation

## 2022-04-05 VITALS — BP 137/91 | HR 117 | Ht 64.0 in | Wt 305.0 lb

## 2022-04-05 DIAGNOSIS — G894 Chronic pain syndrome: Secondary | ICD-10-CM | POA: Diagnosis present

## 2022-04-05 DIAGNOSIS — G5601 Carpal tunnel syndrome, right upper limb: Secondary | ICD-10-CM | POA: Diagnosis present

## 2022-04-05 DIAGNOSIS — Z79899 Other long term (current) drug therapy: Secondary | ICD-10-CM | POA: Insufficient documentation

## 2022-04-05 DIAGNOSIS — M5440 Lumbago with sciatica, unspecified side: Secondary | ICD-10-CM | POA: Diagnosis present

## 2022-04-05 DIAGNOSIS — Z5181 Encounter for therapeutic drug level monitoring: Secondary | ICD-10-CM | POA: Diagnosis not present

## 2022-04-05 MED ORDER — LIDOCAINE 5 % EX PTCH: 2.0000 | MEDICATED_PATCH | CUTANEOUS | 5 refills | Status: AC

## 2022-04-05 MED ORDER — PREGABALIN 100 MG PO CAPS: 100.0000 mg | ORAL_CAPSULE | Freq: Two times a day (BID) | ORAL | 3 refills | Status: DC

## 2022-04-05 NOTE — Patient Instructions (Signed)
Today, we performed a urine drug screen and signed a controlled substance contract.  If results are as expected, I will refill your oxycodone for 3 months.  In the future, we can talk about transitioning to methadone, but I will not increase the dose of oxycodone at this time.  We did increase your dose of Lyrica today to 100 mg twice a day.  I refilled your lidocaine patches  Please start your aqua therapy.  I will follow-up with you in 2 months.  I will also get you on the schedule for possible epidural steroid injections with Dr. Charleen Kirks in 3 months, and if it 66-month follow-up things or not improving, we can get imaging of your back in anticipation of that appointment.  Please call the clinic if any questions, concerns.

## 2022-04-08 LAB — TOXASSURE SELECT,+ANTIDEPR,UR

## 2022-04-08 NOTE — Assessment & Plan Note (Signed)
We did increase your dose of Lyrica today to 100 mg twice a day.  I refilled your lidocaine patches  Please start your aqua therapy.  I will follow-up with you in 2 months.  I will also get you on the schedule for possible epidural steroid injections with Dr. Letta Pate in 3 months, and if it 30-month follow-up things or not improving, we can get imaging of your back in anticipation of that appointment.  Please call the clinic if any questions, concerns.

## 2022-04-08 NOTE — Assessment & Plan Note (Signed)
Today, we performed a urine drug screen and signed a controlled substance contract.  If results are as expected, I will refill your oxycodone for 3 months.  In the future, we can talk about transitioning to methadone, but I will not increase the dose of oxycodone at this time.  Indication for chronic opioid: Low Back pain Medication and dose: Oxycodone 10 mg Q4H PRN # pills per month: 180 Last UDS date: 04/05/22 Opioid Treatment Agreement signed (Y/N): Y, 03/04/22 Opioid Treatment Agreement last reviewed with patient:   NCCSRS/PDMP reviewed this encounter (include red flags): Yes   Management will include: Medium Risk (10-90 MME) UDS every 3-6 months NCCSR check every visit Follow up Q1M initially, Q3M once established

## 2022-04-08 NOTE — Assessment & Plan Note (Signed)
Continue nighttime braces for at least 6 weeks; patient states she is seeing benefit Lyrica increased

## 2022-04-13 ENCOUNTER — Encounter (HOSPITAL_BASED_OUTPATIENT_CLINIC_OR_DEPARTMENT_OTHER): Payer: Medicaid Other | Admitting: Physical Therapy

## 2022-04-13 MED ORDER — OXYCODONE HCL 10 MG PO TABS: 10.0000 mg | ORAL_TABLET | ORAL | 0 refills | Status: AC | PRN

## 2022-04-13 MED ORDER — OXYCODONE HCL 10 MG PO TABS: 10.0000 mg | ORAL_TABLET | ORAL | 0 refills | Status: DC | PRN

## 2022-04-13 NOTE — Addendum Note (Signed)
Addended by: Elijah Birk on: 04/13/2022 01:03 PM   Modules accepted: Orders

## 2022-04-15 ENCOUNTER — Ambulatory Visit (HOSPITAL_BASED_OUTPATIENT_CLINIC_OR_DEPARTMENT_OTHER): Payer: Medicaid Other | Admitting: Physical Therapy

## 2022-04-20 ENCOUNTER — Ambulatory Visit (HOSPITAL_BASED_OUTPATIENT_CLINIC_OR_DEPARTMENT_OTHER): Payer: Medicaid Other | Attending: Physical Medicine and Rehabilitation | Admitting: Physical Therapy

## 2022-04-20 ENCOUNTER — Encounter (HOSPITAL_BASED_OUTPATIENT_CLINIC_OR_DEPARTMENT_OTHER): Payer: Self-pay | Admitting: Physical Therapy

## 2022-04-20 DIAGNOSIS — R2689 Other abnormalities of gait and mobility: Secondary | ICD-10-CM | POA: Diagnosis present

## 2022-04-20 DIAGNOSIS — R293 Abnormal posture: Secondary | ICD-10-CM

## 2022-04-20 DIAGNOSIS — M5442 Lumbago with sciatica, left side: Secondary | ICD-10-CM | POA: Insufficient documentation

## 2022-04-20 DIAGNOSIS — G8929 Other chronic pain: Secondary | ICD-10-CM | POA: Diagnosis present

## 2022-04-20 DIAGNOSIS — M6281 Muscle weakness (generalized): Secondary | ICD-10-CM

## 2022-04-20 NOTE — Therapy (Signed)
OUTPATIENT PHYSICAL THERAPY THORACOLUMBAR TREATMENT   Patient Name: Alyssa Hansen MRN: [id number] DOB:[birth year], 32 y.o., female Today's Date: 04/20/2022  END OF SESSION:  PT End of Session - 04/20/22 0915     Visit Number 2    Date for PT Re-Evaluation 05/12/22    Authorization Type [employer] - requesting 10 visits 1-2x/week from 03/19/22 to 05/12/22    PT Start Time 0911   pt arrived late   PT Stop Time 0945    PT Time Calculation (min) 34 min    Activity Tolerance Patient limited by pain             Past Medical History:  Diagnosis Date   ADHD (attention deficit hyperactivity disorder)    ADHD (attention deficit hyperactivity disorder)    ADHD (attention deficit hyperactivity disorder)    Anxiety    Bipolar 1 disorder, depressed    Chlamydia    Depression    Drug abuse    Herpes    + blood test, never had outbreak   History of suicidal tendencies    Hypothyroidism    No meds.   Obesity    PCOS (polycystic ovarian syndrome)    Restless legs    Scoliosis    Substance abuse    Trichomonas    Past Surgical History:  Procedure Laterality Date   CESAREAN SECTION     LAPAROSCOPIC ABDOMINAL EXPLORATION     Patient Active Problem List   Diagnosis Date Noted   Encounter for long-term (current) use of high-risk medication 04/05/2022   Right carpal tunnel syndrome 03/04/2022   Restless legs 03/04/2022   Chronic pain syndrome 03/01/2022   Morbid obesity due to excess calories 02/01/2021   Tobacco user 02/01/2021   OSA (obstructive sleep apnea) 03/19/2020   Opioid dependence 04/15/2012   Cocaine abuse 04/15/2012   Opioid use with withdrawal 04/15/2012   Substance induced mood disorder 04/15/2012   LOW BACK PAIN 08/02/2007    PCP: no PCP  REFERRING PROVIDER: Angelina Sheriff, DO  REFERRING DIAG: G26.4 (ICD-10-CM) - Chronic pain syndrome M54.42,G89.29 (ICD-10-CM) - Chronic bilateral low back pain with left-sided sciatica  Rationale for Evaluation and  Treatment: Rehabilitation  THERAPY DIAG:  Chronic left-sided low back pain with left-sided sciatica  Abnormal posture  Other abnormalities of gait and mobility  Muscle weakness (generalized)  ONSET DATE: chronic  SUBJECTIVE:                                                                                                                                                                                           SUBJECTIVE STATEMENT: Pt reports that it  has been a busy morning.  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    PERTINENT HISTORY:  Per chart review, note from referring MD: ADHD, Anxiety, Bipolar 1 disorder, depressed (HCC), Depression, Drug abuse (HCC), History of suicidal tendencies, Hypothyroidism, Obesity, PCOS (polycystic ovarian syndrome), Restless legs, Scoliosis, Substance abuse (HCC), and Trichomonas.     PAIN:  PAIN:  Are you having pain? Yes NPRS scale: 6-7/10 Pain location: LBP central and spreads bil, Lt>Rt throbbing and sharp shooting pains Pain orientation: Bilateral  PAIN TYPE: aching, sharp, and throbbing Pain description: constant  Aggravating factors: standing and walking, all physical movement Relieving factors: oxycodone and laying down, ice, TENS unit   PRECAUTIONS: None  WEIGHT BEARING RESTRICTIONS: No  FALLS:  Has patient fallen in last 6 months? Yes. Number of falls 1 in Oct, climbing on ladder, fell 3 feet onto concrete  LIVING ENVIRONMENT: Lives with: lives with their family - husband and 3 kids ages 34-14 Lives in: House/apartment Stairs: Yes: External: 4 steps; none Has following equipment at home: Single point cane, shower bench  OCCUPATION: stay at home mom  PLOF: Independent  PATIENT GOALS: try aquatic PT b/c land-based PT has worsened pain in past  NEXT MD VISIT: April 1  OBJECTIVE:   DIAGNOSTIC FINDINGS:  Intact bone structure low back per Pt - has had updated MRI and CT scan but not in chart since  2018  PATIENT SURVEYS:  Modified Oswestry 32/50   SCREENING FOR RED FLAGS: Bowel or bladder incontinence: No Spinal tumors: No Cauda equina syndrome: No Compression fracture: No Abdominal aneurysm: No  COGNITION: Overall cognitive status: Within functional limits for tasks assessed     SENSATION: Both legs go numb with sitting > 20-30 min  MUSCLE LENGTH: Hamstrings: 50% bil Thomas test: 50% bil  POSTURE: rounded shoulders, forward head, increased lumbar lordosis, increased thoracic kyphosis, and anterior pelvic tilt  PALPATION: Global tenderness, tight QL and lumbar paraspinals bil, tight upper traps and pecs  CERVICAL ROM: Starts from signif forward head with extension hinge Bil rotation 60 deg with contralateral pain Flexion 40 deg Extension 12 deg  LUMBAR ROM:   AROM eval  Flexion Fingers to midshin, if bends knees gets relief  Extension Not tested  Right lateral flexion 75%  Left lateral flexion 60%  Right rotation 30%  Left rotation 30%   (Blank rows = not tested)  LOWER EXTREMITY ROM:     WFL  LOWER EXTREMITY MMT:   Bil shoulders 4-/5 Bil hips 4-/5 Bil knees 4/5   FUNCTIONAL TESTS:  5 times sit to stand: 22.45 sec Timed up and go (TUG): 14.23 sec 3 minute walk test: 398 feet, pain up to 7/10, SOB  GAIT: Distance walked: 398 Assistive device utilized: None Level of assistance: Complete Independence Comments: short stride length, lacks trunk rotation and arm swing  TODAY'S TREATMENT:  DATE: 04/20/22  Pt seen for aquatic therapy today.  Treatment took place in water 3.5-4.75 ft in depth at the [facility] pool. Temp of water was 91.  Pt entered/exited the pool via stairs with step-to pattern with bilat rail. Walking unsupported forward/ backward and side stepping (with arm abdct/ addct) - mutiple widths of each  B  KTC stretch holding rails with feet in bottom ladder hole x 15s x 2 TrA set with tricep push down with rainbow hand floats x 10 Seated on bench - cycling, alternating LAQ with DF (not tolerated on LLE); STS at bench with feet on blue step with cues for forward arm reach and hip hinge Straddling noodle in deeper water, for decompression and intermittent cycling of LEs Pt requires the buoyancy and hydrostatic pressure of water for support, and to offload joints by unweighting joint load by at least 50 % in navel deep water and by at least 75-80% in chest to neck deep water.  Viscosity of the water is needed for resistance of strengthening. Water current perturbations provides challenge to standing balance requiring increased core activation.  PATIENT EDUCATION:  Education details: aquatic therapy intro Person educated: Patient Education method: Chief Technology Officer Education comprehension: verbalized understanding  HOME EXERCISE PROGRAM: Will do aquatic PT 2x/week then reassess for readiness of HEP  ASSESSMENT:  CLINICAL IMPRESSION: Pt is confident in aquatic setting and able to take direction from therapist on deck.  She reported slight increase in BLE / back pain during session during exertion.  Pt instructed in TrA set and encouraged to complete this with STS, reaching and lifting. Pt verbalized limited access to pool outside of therapy, but does live near pond.  Goals are ongoing.    FROM EVAL: Patient is a 32 y.o. female who was seen today for physical therapy evaluation and treatment for chronic pain syndrome and chronic LBP with bil sciatica Lt>Rt.  Pt is specifically referred to try aquatic PT b/c land-based PT has worsened her symptoms.  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  She has constant pain made worse by all physical movement.  Pain has been present since age 51 and worsened over time.  Pain ranges from 5-9/10.  She reports bil leg numbness with sitting 20-30 min  and sharp shooting pains into legs with standing and walking.  She uses a walking stick when running errands.  She has limited mobility and ROM throughout trunk and proximal joints.  Body habitus contributes to ROM limitations.  She has 4/5 strength throughout UE/LE with pain on testing.  She was able to participate in today covering 398' without AD but pain spiked to 7/10 with this.  She was tearful throughout session secondary to pain with physical testing.  ODI score is 32/50 falling in severe disability category.  Pt will benefit from aquatic environment to initiate mobility, stretching, strengthening and endurance given high pain levels and limited tolerance of land-based movement.  OBJECTIVE IMPAIRMENTS: Abnormal gait, decreased activity tolerance, decreased balance, decreased mobility, decreased ROM, decreased strength, hypomobility, increased fascial restrictions, increased muscle spasms, impaired flexibility, impaired sensation, impaired tone, impaired UE functional use, improper body mechanics, postural dysfunction, obesity, and pain.   ACTIVITY LIMITATIONS: carrying, lifting, bending, standing, squatting, sleeping, stairs, transfers, bed mobility, bathing, dressing, reach over head, and locomotion level  PARTICIPATION LIMITATIONS: meal prep, cleaning, laundry, shopping, community activity, and yard work  PERSONAL FACTORS: Time since onset of injury/illness/exacerbation and 1-2 comorbidities: obesity,  are also affecting patient's functional outcome.   REHAB POTENTIAL: Good  CLINICAL DECISION MAKING: Stable/uncomplicated  EVALUATION COMPLEXITY: Low   GOALS: Goals reviewed with patient? Yes  SHORT TERM GOALS: Target date: 04/14/22  Pt will be able to participate in aquatic PT for at least 25 min of active mobility of trunk, LE, UE without exacerbation of pain Baseline: Goal status: INITIAL  2.  Pt will be able to perform a in the pool without exacerbation of  pain Baseline:  Goal status: INITIAL  3.  Pt will learn proper activations of deep core, scapular stabilizers, postural support muscles and LE prime movers in an aquatic environment to build strength and promote ROM with improved alignment. Baseline:  Goal status: INITIAL    LONG TERM GOALS: Target date: 05/12/22  Pt will improve to cover at least 450' with pain not to exceed 6/10 Baseline:  Goal status: INITIAL  2.  Pt will explore option to include LRAD to improve land-based tolerance of walking for exercise and community outings/errands Baseline:  Goal status: INITIAL  3.  Pt will improve ODI score by at least 8 points to improve disability level from severe to moderate. Baseline: 32/50 Goal status: INITIAL  4.  Pt will improve UE and LE strength to at least 4+/5 to improve tolerance of daily household tasks. Baseline:  Goal status: INITIAL  5.  Pt will report improved tolerance of standing tasks for up to 20 min for light household tasks. Baseline:  Goal status: INITIAL  6.  Pt will learn land-based stretches and strengthening exercises she can perform for HEP after a round of aquatic PT. Baseline:  Goal status: INITIAL  PLAN:  PT FREQUENCY:1-2x/week for 9 aquatic visits and 10th visit land-based re-evaluation  PT DURATION: 8 weeks  PLANNED INTERVENTIONS: Therapeutic exercises, Therapeutic activity, Neuromuscular re-education, Balance training, Gait training, Patient/Family education, Self Care, and Aquatic Therapy.  PLAN FOR NEXT SESSION: start aquatic PT - work on general mobility of trunk/UE/LE, postural strength, gait endurance for chronic pain and deconditioning Mayer Camel, PTA 04/20/22 1:39 PM [facility] [address] [place], [place], [address] Phone: [phone]   Fax:  [phone]

## 2022-04-22 ENCOUNTER — Ambulatory Visit (HOSPITAL_BASED_OUTPATIENT_CLINIC_OR_DEPARTMENT_OTHER): Payer: Medicaid Other | Admitting: Physical Therapy

## 2022-04-22 ENCOUNTER — Encounter (HOSPITAL_BASED_OUTPATIENT_CLINIC_OR_DEPARTMENT_OTHER): Payer: Self-pay | Admitting: Physical Therapy

## 2022-04-22 DIAGNOSIS — G8929 Other chronic pain: Secondary | ICD-10-CM

## 2022-04-22 DIAGNOSIS — M5442 Lumbago with sciatica, left side: Secondary | ICD-10-CM | POA: Diagnosis not present

## 2022-04-22 DIAGNOSIS — R293 Abnormal posture: Secondary | ICD-10-CM

## 2022-04-22 DIAGNOSIS — M6281 Muscle weakness (generalized): Secondary | ICD-10-CM

## 2022-04-22 DIAGNOSIS — R2689 Other abnormalities of gait and mobility: Secondary | ICD-10-CM

## 2022-04-22 NOTE — Therapy (Signed)
OUTPATIENT PHYSICAL THERAPY THORACOLUMBAR TREATMENT   Patient Name: Alyssa Hansen MRN: [id number] DOB:[birth year], 32 y.o., female Today's Date: 04/22/2022  END OF SESSION:  PT End of Session - 04/22/22 0954     Visit Number 3    Date for PT Re-Evaluation 05/12/22    Authorization Type [employer] - requesting 10 visits 1-2x/week from 03/19/22 to 05/12/22    PT Start Time 0947    PT Stop Time 1025    PT Time Calculation (min) 38 min    Activity Tolerance Patient tolerated treatment well    Behavior During Therapy WFL for tasks assessed/performed              Past Medical History:  Diagnosis Date   ADHD (attention deficit hyperactivity disorder)    ADHD (attention deficit hyperactivity disorder)    ADHD (attention deficit hyperactivity disorder)    Anxiety    Bipolar 1 disorder, depressed    Chlamydia    Depression    Drug abuse    Herpes    + blood test, never had outbreak   History of suicidal tendencies    Hypothyroidism    No meds.   Obesity    PCOS (polycystic ovarian syndrome)    Restless legs    Scoliosis    Substance abuse    Trichomonas    Past Surgical History:  Procedure Laterality Date   CESAREAN SECTION     LAPAROSCOPIC ABDOMINAL EXPLORATION     Patient Active Problem List   Diagnosis Date Noted   Encounter for long-term (current) use of high-risk medication 04/05/2022   Right carpal tunnel syndrome 03/04/2022   Restless legs 03/04/2022   Chronic pain syndrome 03/01/2022   Morbid obesity due to excess calories 02/01/2021   Tobacco user 02/01/2021   OSA (obstructive sleep apnea) 03/19/2020   Opioid dependence 04/15/2012   Cocaine abuse 04/15/2012   Opioid use with withdrawal 04/15/2012   Substance induced mood disorder 04/15/2012   LOW BACK PAIN 08/02/2007    PCP: no PCP  REFERRING PROVIDER: Angelina Sheriff, DO  REFERRING DIAG: G81.4 (ICD-10-CM) - Chronic pain syndrome M54.42,G89.29 (ICD-10-CM) - Chronic bilateral low back pain with  left-sided sciatica  Rationale for Evaluation and Treatment: Rehabilitation  THERAPY DIAG:  Chronic left-sided low back pain with left-sided sciatica  Abnormal posture  Other abnormalities of gait and mobility  Muscle weakness (generalized)  ONSET DATE: chronic  SUBJECTIVE:  SUBJECTIVE STATEMENT: 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  "I could hardly move this morning".  Pt reports she was sore after first session.  PERTINENT HISTORY:  Per chart review, note from referring MD: ADHD, Anxiety, Bipolar 1 disorder, depressed (HCC), Depression, Drug abuse (HCC), History of suicidal tendencies, Hypothyroidism, Obesity, PCOS (polycystic ovarian syndrome), Restless legs, Scoliosis, Substance abuse (HCC), and Trichomonas.     PAIN:  PAIN:  Are you having pain? Yes NPRS scale: 6-7/10 Pain location: LBP central and spreads bil, Lt>Rt throbbing and sharp shooting pains Pain orientation: Bilateral  PAIN TYPE: aching, sharp, and throbbing Pain description: constant  Aggravating factors: standing and walking, all physical movement Relieving factors: oxycodone and laying down, ice, TENS unit   PRECAUTIONS: None  WEIGHT BEARING RESTRICTIONS: No  FALLS:  Has patient fallen in last 6 months? Yes. Number of falls 1 in Oct, climbing on ladder, fell 3 feet onto concrete  LIVING ENVIRONMENT: Lives with: lives with their family - husband and 3 kids ages 74-14 Lives in: House/apartment Stairs: Yes: External: 4 steps; none Has following equipment at home: Single point cane, shower bench  OCCUPATION: stay at home mom  PLOF: Independent  PATIENT GOALS: try aquatic PT b/c land-based PT has worsened pain in past  NEXT MD VISIT: April 1  OBJECTIVE:   DIAGNOSTIC FINDINGS:   Intact bone structure low back per Pt - has had updated MRI and CT scan but not in chart since 2018  PATIENT SURVEYS:  Modified Oswestry 32/50   SCREENING FOR RED FLAGS: Bowel or bladder incontinence: No Spinal tumors: No Cauda equina syndrome: No Compression fracture: No Abdominal aneurysm: No  COGNITION: Overall cognitive status: Within functional limits for tasks assessed     SENSATION: Both legs go numb with sitting > 20-30 min  MUSCLE LENGTH: Hamstrings: 50% bil Thomas test: 50% bil  POSTURE: rounded shoulders, forward head, increased lumbar lordosis, increased thoracic kyphosis, and anterior pelvic tilt  PALPATION: Global tenderness, tight QL and lumbar paraspinals bil, tight upper traps and pecs  CERVICAL ROM: Starts from signif forward head with extension hinge Bil rotation 60 deg with contralateral pain Flexion 40 deg Extension 12 deg  LUMBAR ROM:   AROM eval  Flexion Fingers to midshin, if bends knees gets relief  Extension Not tested  Right lateral flexion 75%  Left lateral flexion 60%  Right rotation 30%  Left rotation 30%   (Blank rows = not tested)  LOWER EXTREMITY ROM:     WFL  LOWER EXTREMITY MMT:   Bil shoulders 4-/5 Bil hips 4-/5 Bil knees 4/5   FUNCTIONAL TESTS:  5 times sit to stand: 22.45 sec Timed up and go (TUG): 14.23 sec 3 minute walk test: 398 feet, pain up to 7/10, SOB  GAIT: Distance walked: 398 Assistive device utilized: None Level of assistance: Complete Independence Comments: short stride length, lacks trunk rotation and arm swing  TODAY'S TREATMENT:  DATE: 04/20/22  Pt seen for aquatic therapy today.  Treatment took place in water 3.5-4.75 ft in depth at the [facility] pool. Temp of water was 91.  Pt entered/exited the pool via stairs with step-to pattern with bilat rail. Walking  unsupported forward/ backward and side stepping (with arm abdct/ addct)  L stretch followed by trunk ext x 2 Yellow noodle under arms and dangling LE for decompression - cycling -> switched to noodle between legs, with cycling and UE breast stroke 2- way LE stretch ( adductor and hamstring)  with ankle supported by blue noodle with hole, 15s each LE Return to walking forward/ backward and side stepping Holding wall:  single leg clams x 10 each  At stairs:  fig 4 stretch x 5sec x 2 reps,  quad stretch x 10s x 2 Pt requires the buoyancy and hydrostatic pressure of water for support, and to offload joints by unweighting joint load by at least 50 % in navel deep water and by at least 75-80% in chest to neck deep water.  Viscosity of the water is needed for resistance of strengthening. Water current perturbations provides challenge to standing balance requiring increased core activation.  PATIENT EDUCATION:  Education details: aquatic therapy intro Person educated: Patient Education method: Chief Technology Officer Education comprehension: verbalized understanding  HOME EXERCISE PROGRAM: Will do aquatic PT 2x/week then reassess for readiness of HEP  ASSESSMENT:  CLINICAL IMPRESSION: Pt tolerated suspended cycling better today.  She reported slight increase in pain with fatigue as session went on.  Pt encouraged to use TrA set when rolling pt at work, and to see if hospital bed has trendelenburg feature to assist with pulling pt up in bed.  LE remain tight; plan to issue gentle land LE stretches in future sessions.  Goals are ongoing.    FROM EVAL: Patient is a 32 y.o. female who was seen today for physical therapy evaluation and treatment for chronic pain syndrome and chronic LBP with bil sciatica Lt>Rt.  Pt is specifically referred to try aquatic PT b/c land-based PT has worsened her symptoms.  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  She has constant pain made worse by all physical  movement.  Pain has been present since age 12 and worsened over time.  Pain ranges from 5-9/10.  She reports bil leg numbness with sitting 20-30 min and sharp shooting pains into legs with standing and walking.  She uses a walking stick when running errands.  She has limited mobility and ROM throughout trunk and proximal joints.  Body habitus contributes to ROM limitations.  She has 4/5 strength throughout UE/LE with pain on testing.  She was able to participate in today covering 398' without AD but pain spiked to 7/10 with this.  She was tearful throughout session secondary to pain with physical testing.  ODI score is 32/50 falling in severe disability category.  Pt will benefit from aquatic environment to initiate mobility, stretching, strengthening and endurance given high pain levels and limited tolerance of land-based movement.  OBJECTIVE IMPAIRMENTS: Abnormal gait, decreased activity tolerance, decreased balance, decreased mobility, decreased ROM, decreased strength, hypomobility, increased fascial restrictions, increased muscle spasms, impaired flexibility, impaired sensation, impaired tone, impaired UE functional use, improper body mechanics, postural dysfunction, obesity, and pain.   ACTIVITY LIMITATIONS: carrying, lifting, bending, standing, squatting, sleeping, stairs, transfers, bed mobility, bathing, dressing, reach over head, and locomotion level  PARTICIPATION LIMITATIONS: meal prep, cleaning, laundry, shopping, community activity, and  yard work  PERSONAL FACTORS: Time since onset of injury/illness/exacerbation and 1-2 comorbidities: obesity,   are also affecting patient's functional outcome.   REHAB POTENTIAL: Good  CLINICAL DECISION MAKING: Stable/uncomplicated  EVALUATION COMPLEXITY: Low   GOALS: Goals reviewed with patient? Yes  SHORT TERM GOALS: Target date: 04/14/22  Pt will be able to participate in aquatic PT for at least 25 min of active mobility of trunk, LE, UE  without exacerbation of pain Baseline: Goal status: INITIAL  2.  Pt will be able to perform a in the pool without exacerbation of pain Baseline:  Goal status: INITIAL  3.  Pt will learn proper activations of deep core, scapular stabilizers, postural support muscles and LE prime movers in an aquatic environment to build strength and promote ROM with improved alignment. Baseline:  Goal status: INITIAL    LONG TERM GOALS: Target date: 05/12/22  Pt will improve to cover at least 450' with pain not to exceed 6/10 Baseline:  Goal status: INITIAL  2.  Pt will explore option to include LRAD to improve land-based tolerance of walking for exercise and community outings/errands Baseline:  Goal status: INITIAL  3.  Pt will improve ODI score by at least 8 points to improve disability level from severe to moderate. Baseline: 32/50 Goal status: INITIAL  4.  Pt will improve UE and LE strength to at least 4+/5 to improve tolerance of daily household tasks. Baseline:  Goal status: INITIAL  5.  Pt will report improved tolerance of standing tasks for up to 20 min for light household tasks. Baseline:  Goal status: INITIAL  6.  Pt will learn land-based stretches and strengthening exercises she can perform for HEP after a round of aquatic PT. Baseline:  Goal status: INITIAL  PLAN:  PT FREQUENCY:1-2x/week for 9 aquatic visits and 10th visit land-based re-evaluation  PT DURATION: 8 weeks  PLANNED INTERVENTIONS: Therapeutic exercises, Therapeutic activity, Neuromuscular re-education, Balance training, Gait training, Patient/Family education, Self Care, and Aquatic Therapy.  PLAN FOR NEXT SESSION: start aquatic PT - work on general mobility of trunk/UE/LE, postural strength, gait endurance for chronic pain and deconditioning Mayer Camel, PTA 04/22/22 10:28 AM [facility] [address] [place], [place], [address] Phone:  [phone]   Fax:  [phone]

## 2022-04-27 ENCOUNTER — Encounter (HOSPITAL_BASED_OUTPATIENT_CLINIC_OR_DEPARTMENT_OTHER): Payer: Self-pay | Admitting: Physical Therapy

## 2022-04-27 ENCOUNTER — Ambulatory Visit (HOSPITAL_BASED_OUTPATIENT_CLINIC_OR_DEPARTMENT_OTHER): Payer: Medicaid Other | Admitting: Physical Therapy

## 2022-04-27 DIAGNOSIS — R2689 Other abnormalities of gait and mobility: Secondary | ICD-10-CM

## 2022-04-27 DIAGNOSIS — M6281 Muscle weakness (generalized): Secondary | ICD-10-CM

## 2022-04-27 DIAGNOSIS — M5442 Lumbago with sciatica, left side: Secondary | ICD-10-CM | POA: Diagnosis not present

## 2022-04-27 DIAGNOSIS — G8929 Other chronic pain: Secondary | ICD-10-CM

## 2022-04-27 DIAGNOSIS — R293 Abnormal posture: Secondary | ICD-10-CM

## 2022-04-27 NOTE — Therapy (Signed)
OUTPATIENT PHYSICAL THERAPY THORACOLUMBAR TREATMENT   Patient Name: Alyssa Hansen MRN: [id number] DOB:[birth year], 32 y.o., female Today's Date: 04/27/2022  END OF SESSION:  PT End of Session - 04/27/22 1053     Visit Number 4    Date for PT Re-Evaluation 05/12/22    Authorization Type [employer] - requesting 10 visits 1-2x/week from 03/19/22 to 05/12/22    PT Start Time 1035    PT Stop Time 1115    PT Time Calculation (min) 40 min    Activity Tolerance Patient tolerated treatment well    Behavior During Therapy WFL for tasks assessed/performed              Past Medical History:  Diagnosis Date   ADHD (attention deficit hyperactivity disorder)    ADHD (attention deficit hyperactivity disorder)    ADHD (attention deficit hyperactivity disorder)    Anxiety    Bipolar 1 disorder, depressed    Chlamydia    Depression    Drug abuse    Herpes    + blood test, never had outbreak   History of suicidal tendencies    Hypothyroidism    No meds.   Obesity    PCOS (polycystic ovarian syndrome)    Restless legs    Scoliosis    Substance abuse    Trichomonas    Past Surgical History:  Procedure Laterality Date   CESAREAN SECTION     LAPAROSCOPIC ABDOMINAL EXPLORATION     Patient Active Problem List   Diagnosis Date Noted   Encounter for long-term (current) use of high-risk medication 04/05/2022   Right carpal tunnel syndrome 03/04/2022   Restless legs 03/04/2022   Chronic pain syndrome 03/01/2022   Morbid obesity due to excess calories 02/01/2021   Tobacco user 02/01/2021   OSA (obstructive sleep apnea) 03/19/2020   Opioid dependence 04/15/2012   Cocaine abuse 04/15/2012   Opioid use with withdrawal 04/15/2012   Substance induced mood disorder 04/15/2012   LOW BACK PAIN 08/02/2007    PCP: no PCP  REFERRING PROVIDER: Angelina Sheriff, DO  REFERRING DIAG: G56.4 (ICD-10-CM) - Chronic pain syndrome M54.42,G89.29 (ICD-10-CM) - Chronic bilateral low back pain with  left-sided sciatica  Rationale for Evaluation and Treatment: Rehabilitation  THERAPY DIAG:  Chronic left-sided low back pain with left-sided sciatica  Abnormal posture  Other abnormalities of gait and mobility  Muscle weakness (generalized)  ONSET DATE: chronic  SUBJECTIVE:  SUBJECTIVE STATEMENT: Pt reports that she suggested to her pt's son that her pt assist with bed mobility by using UE on rail, and that the son refused for his mother to help.  She states that there is no trendelenburg function on bed to assist with pulling her up.  She reports that she had to work after her last aquatic session and that the combination of work and aquatics made her very sore.  She has the next 4 days off.   session.  PERTINENT HISTORY:  Per chart review, note from referring MD: ADHD, Anxiety, Bipolar 1 disorder, depressed (HCC), Depression, Drug abuse (HCC), History of suicidal tendencies, Hypothyroidism, Obesity, PCOS (polycystic ovarian syndrome), Restless legs, Scoliosis, Substance abuse (HCC), and Trichomonas.     PAIN:  PAIN:  Are you having pain? Yes NPRS scale: 5/10 Pain location: LBP central and spreads bil, Lt>Rt throbbing and sharp shooting pains Pain orientation: Bilateral  PAIN TYPE: aching, sharp, and throbbing Pain description: constant  Aggravating factors: standing and walking, all physical movement Relieving factors: oxycodone and laying down, ice, TENS unit   PRECAUTIONS: None  WEIGHT BEARING RESTRICTIONS: No  FALLS:  Has patient fallen in last 6 months? Yes. Number of falls 1 in Oct, climbing on ladder, fell 3 feet onto concrete  LIVING ENVIRONMENT: Lives with: lives with their family - husband and 3 kids ages 6-14 Lives in: House/apartment Stairs: Yes: External: 4 steps; none Has  following equipment at home: Single point cane, shower bench  OCCUPATION: stay at home mom  PLOF: Independent  PATIENT GOALS: try aquatic PT b/c land-based PT has worsened pain in past  NEXT MD VISIT: April 1  OBJECTIVE:   DIAGNOSTIC FINDINGS:  Intact bone structure low back per Pt - has had updated MRI and CT scan but not in chart since 2018  PATIENT SURVEYS:  Modified Oswestry 32/50   SCREENING FOR RED FLAGS: Bowel or bladder incontinence: No Spinal tumors: No Cauda equina syndrome: No Compression fracture: No Abdominal aneurysm: No  COGNITION: Overall cognitive status: Within functional limits for tasks assessed     SENSATION: Both legs go numb with sitting > 20-30 min  MUSCLE LENGTH: Hamstrings: 50% bil Thomas test: 50% bil  POSTURE: rounded shoulders, forward head, increased lumbar lordosis, increased thoracic kyphosis, and anterior pelvic tilt  PALPATION: Global tenderness, tight QL and lumbar paraspinals bil, tight upper traps and pecs  CERVICAL ROM: Starts from signif forward head with extension hinge Bil rotation 60 deg with contralateral pain Flexion 40 deg Extension 12 deg  LUMBAR ROM:   AROM eval  Flexion Fingers to midshin, if bends knees gets relief  Extension Not tested  Right lateral flexion 75%  Left lateral flexion 60%  Right rotation 30%  Left rotation 30%   (Blank rows = not tested)  LOWER EXTREMITY ROM:     WFL  LOWER EXTREMITY MMT:   Bil shoulders 4-/5 Bil hips 4-/5 Bil knees 4/5   FUNCTIONAL TESTS:  5 times sit to stand: 22.45 sec Timed up and go (TUG): 14.23 sec 3 minute walk test: 398 feet, pain up to 7/10, SOB  GAIT: Distance walked: 398 Assistive device utilized: None Level of assistance: Complete Independence Comments: short stride length, lacks trunk rotation and arm swing  TODAY'S TREATMENT:  DATE: 04/27/22  Pt seen for aquatic therapy today.  Treatment took place in water 3.5-4.75 ft in depth at the [facility] pool. Temp of water was 91.  Pt entered/exited the pool via stairs with step-to pattern with bilat rail. Walking unsupported forward/ backward and side stepping (with arm abdct/ addct)  L stretch followed by trunk ext x 20 seconds Staggered stance with arm addct with rainbow hand floats x 5, each leg forward;  standard stance with tricep press down with rainbow handfloats Yellow noodle under arms and dangling LE for decompression -> switched to noodle between legs, with cycling and UE breast stroke, small trial of jumping jack LEs and cc ski 3- way LE stretch with ankle supported by solid noodle, back against wall At stairs:  L stretch;  fig 4 stretch x 5sec x 2 reps (painful on LLE) Pt requires the buoyancy and hydrostatic pressure of water for support, and to offload joints by unweighting joint load by at least 50 % in navel deep water and by at least 75-80% in chest to neck deep water.  Viscosity of the water is needed for resistance of strengthening. Water current perturbations provides challenge to standing balance requiring increased core activation.  PATIENT EDUCATION:  Education details: aquatic therapy exercise progressions/ modifications  Person educated: Patient Education method: Chief Technology Officer Education comprehension: verbalized understanding  HOME EXERCISE PROGRAM: Will do aquatic PT 2x/week then reassess for readiness of HEP  ASSESSMENT:  CLINICAL IMPRESSION: Pt tolerated suspended cycling better than previous sessions.  She reported some upper trap tightness at end of session, and low back pain decrease by 1 point.   LE remain tight; plan to issue gentle land LE stretches in future sessions.  Goals are ongoing.    FROM EVAL: Patient is a 32 y.o. female who was seen today for physical therapy evaluation and treatment for chronic pain  syndrome and chronic LBP with bil sciatica Lt>Rt.  Pt is specifically referred to try aquatic PT b/c land-based PT has worsened her symptoms.  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  She has constant pain made worse by all physical movement.  Pain has been present since age 33 and worsened over time.  Pain ranges from 5-9/10.  She reports bil leg numbness with sitting 20-30 min and sharp shooting pains into legs with standing and walking.  She uses a walking stick when running errands.  She has limited mobility and ROM throughout trunk and proximal joints.  Body habitus contributes to ROM limitations.  She has 4/5 strength throughout UE/LE with pain on testing.  She was able to participate in today covering 398' without AD but pain spiked to 7/10 with this.  She was tearful throughout session secondary to pain with physical testing.  ODI score is 32/50 falling in severe disability category.  Pt will benefit from aquatic environment to initiate mobility, stretching, strengthening and endurance given high pain levels and limited tolerance of land-based movement.  OBJECTIVE IMPAIRMENTS: Abnormal gait, decreased activity tolerance, decreased balance, decreased mobility, decreased ROM, decreased strength, hypomobility, increased fascial restrictions, increased muscle spasms, impaired flexibility, impaired sensation, impaired tone, impaired UE functional use, improper body mechanics, postural dysfunction, obesity, and pain.   ACTIVITY LIMITATIONS: carrying, lifting, bending, standing, squatting, sleeping, stairs, transfers, bed mobility, bathing, dressing, reach over head, and locomotion level  PARTICIPATION LIMITATIONS: meal prep, cleaning, laundry, shopping, community activity, and yard work  PERSONAL FACTORS: Time since onset of injury/illness/exacerbation and 1-2  comorbidities: obesity,   are also affecting patient's functional outcome.   REHAB POTENTIAL: Good  CLINICAL DECISION MAKING:  Stable/uncomplicated  EVALUATION COMPLEXITY: Low   GOALS: Goals reviewed with patient? Yes  SHORT TERM GOALS: Target date: 04/14/22  Pt will be able to participate in aquatic PT for at least 25 min of active mobility of trunk, LE, UE without exacerbation of pain Baseline: Goal status: INITIAL  2.  Pt will be able to perform a in the pool without exacerbation of pain Baseline:  Goal status: INITIAL  3.  Pt will learn proper activations of deep core, scapular stabilizers, postural support muscles and LE prime movers in an aquatic environment to build strength and promote ROM with improved alignment. Baseline:  Goal status: INITIAL    LONG TERM GOALS: Target date: 05/12/22  Pt will improve to cover at least 450' with pain not to exceed 6/10 Baseline:  Goal status: INITIAL  2.  Pt will explore option to include LRAD to improve land-based tolerance of walking for exercise and community outings/errands Baseline:  Goal status: INITIAL  3.  Pt will improve ODI score by at least 8 points to improve disability level from severe to moderate. Baseline: 32/50 Goal status: INITIAL  4.  Pt will improve UE and LE strength to at least 4+/5 to improve tolerance of daily household tasks. Baseline:  Goal status: INITIAL  5.  Pt will report improved tolerance of standing tasks for up to 20 min for light household tasks. Baseline:  Goal status: INITIAL  6.  Pt will learn land-based stretches and strengthening exercises she can perform for HEP after a round of aquatic PT. Baseline:  Goal status: INITIAL  PLAN:  PT FREQUENCY:1-2x/week for 9 aquatic visits and 10th visit land-based re-evaluation  PT DURATION: 8 weeks  PLANNED INTERVENTIONS: Therapeutic exercises, Therapeutic activity, Neuromuscular re-education, Balance training, Gait training, Patient/Family education, Self Care, and Aquatic Therapy.  PLAN FOR NEXT SESSION: continue aquatic PT - work on general mobility of  trunk/UE/LE, postural strength, gait endurance for chronic pain and deconditioning  Mayer Camel, PTA 04/27/22 1:46 PM [facility] [address] [place], [place], [address] Phone: [phone]   Fax:  [phone]

## 2022-04-28 ENCOUNTER — Encounter: Payer: Medicaid Other | Attending: Nurse Practitioner | Admitting: Nutrition

## 2022-04-28 ENCOUNTER — Encounter: Payer: Self-pay | Admitting: Nutrition

## 2022-04-28 VITALS — Ht 64.0 in | Wt 302.0 lb

## 2022-04-28 DIAGNOSIS — Z713 Dietary counseling and surveillance: Secondary | ICD-10-CM | POA: Diagnosis not present

## 2022-04-28 DIAGNOSIS — G894 Chronic pain syndrome: Secondary | ICD-10-CM | POA: Insufficient documentation

## 2022-04-28 DIAGNOSIS — Z6841 Body Mass Index (BMI) 40.0 and over, adult: Secondary | ICD-10-CM | POA: Diagnosis not present

## 2022-04-28 NOTE — Progress Notes (Signed)
Medical Nutrition Therapy  Appointment Start time:  1350  Appointment End time:  1430  Primary concerns today: Obesity  Referral diagnosis: E66.01 Preferred learning style: VIsual Learning readiness: Contemplating   NUTRITION ASSESSMENT  32 yr old female referred for  morbid obesity. PCP Dr. Shearon Stalls 2022   Was about 250 lbs ;Gained 50 lbs in the last 2 years.   Moved here 2 years ago from [facility]. Husband works. She says home; Had 3 kids 6, 9, 15 years.   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 630 pm and get off midnight.  Has back issues.  Less active here . 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  She is willing to work on improving food choices as best she can.  Anthropometrics   04/28/2022  Weight /BMI   Weight 302 lb (H)   Height 5\' 4"  (1.626 m)   BMI 51.84 kg/m2     Legend: (H) High   Clinical Medical Hx:  Past Medical History:  Diagnosis Date   ADHD (attention deficit hyperactivity disorder)    ADHD (attention deficit hyperactivity disorder)    ADHD (attention deficit hyperactivity disorder)    Anxiety    Bipolar 1 disorder, depressed (HCC)    Chlamydia    Depression    Drug abuse (HCC)    Herpes    + blood test, never had outbreak   History of suicidal tendencies    Hypothyroidism    No meds.   Obesity    PCOS (polycystic ovarian syndrome)    Restless legs    Scoliosis    Substance abuse (HCC)    Trichomonas     Medications:  Current Outpatient Medications on File Prior to Visit  Medication Sig Dispense Refill   ADDERALL XR 30 MG 24 hr capsule Take 1 capsule by mouth daily.  0   ALPRAZolam (XANAX) 1 MG tablet Take by mouth.     cholecalciferol (VITAMIN D) 1000 units tablet Take 4,000 Units by mouth daily.     furosemide (LASIX) 40 MG tablet Take 40 mg by mouth daily.     levothyroxine (SYNTHROID) 100 MCG tablet Take  by mouth.     lidocaine (LIDODERM) 5 % Place 2 patches onto the skin daily. 30 patch 5   pyridOXINE (B-6) 50 MG tablet Take by mouth.     acetaminophen (TYLENOL) 500 MG tablet Take 1,000 mg by mouth every 6 (six) hours as needed for moderate pain.     amphetamine-dextroamphetamine (ADDERALL) 15 MG tablet Take 1.5 tablets by mouth daily.     lamoTRIgine (LAMICTAL) 200 MG tablet Take 1 tablet by mouth daily.  3   No current facility-administered medications on file prior to visit.    Labs: No results found for: "HGBA1C"  Notable Signs/Symptoms: tired, no energy, stressed  Lifestyle & Dietary Hx LIves with family.  She cooks and shops at home.  Estimated daily fluid intake: 8 oz Supplements: VIt D Sleep: CPap   6-7 hrs Stress / self-care: life stress of trying to work, care for her kids  Current average weekly physical activity: active but not exercising  24-Hr Dietary Recall First Meal:  Snack:  Second Meal: Rrice Krispie Treat Snack:  Third Meal: Hamburger helper, green beans, roasted potatoes and bell peppers and onions, sweet tea Snack:  Beverages: tea  Estimated Energy Needs Calories:  1200 Carbohydrate: 135g Protein: 90g Fat: 33g   NUTRITION DIAGNOSIS  NI-1.7 Predicted excessive energy intake As related to Obesity from high calorie diet.  As evidenced by BMI 54.   NUTRITION INTERVENTION  Nutrition education (E-1) on the following topics:  Nutrition and Diabetes education provided on My Plate, CHO counting, meal planning, portion sizes, timing of meals, avoiding snacks between meals unless having a low blood sugar, target ranges for A1C and blood sugars, signs/symptoms and treatment of hyper/hypoglycemia, monitoring blood sugars, taking medications as prescribed, benefits of exercising 30 minutes per day and prevention of complications of DM.   Handouts Provided Include  Lifestyle Medicine Handouts Weight loss tips  Learning Style & Readiness for Change Teaching  method utilized: Visual & Auditory  Demonstrated degree of understanding via: Teach Back  Barriers to learning/adherence to lifestyle change: her size for mobility of exercise  Goals Established by Pt Goals  Cut down on sweet tea and try to drink 3 bottles of water per day Eat three meals per day  B) 8 am L)12-2 and D) 5-7. Increase fresh fruits,and vegetables.   MONITORING & EVALUATION Dietary intake, weekly physical activity, and weight in 1 month.  Check an A1C level.  Next Steps  Patient is to work on eating healthier foods and cut out sweet tea.Marland Kitchen

## 2022-04-28 NOTE — Patient Instructions (Addendum)
Goals  Cut down on sweet tea and try to drink 3 bottles of water per day Eat three meals per day  B) 8 am L)12-2 and D) 5-7. Increase fresh fruits,and vegetables.

## 2022-04-29 ENCOUNTER — Ambulatory Visit (HOSPITAL_BASED_OUTPATIENT_CLINIC_OR_DEPARTMENT_OTHER): Payer: Medicaid Other | Admitting: Physical Therapy

## 2022-05-04 ENCOUNTER — Ambulatory Visit (HOSPITAL_BASED_OUTPATIENT_CLINIC_OR_DEPARTMENT_OTHER): Payer: Medicaid Other | Admitting: Physical Therapy

## 2022-05-04 ENCOUNTER — Encounter (HOSPITAL_BASED_OUTPATIENT_CLINIC_OR_DEPARTMENT_OTHER): Payer: Self-pay | Admitting: Physical Therapy

## 2022-05-04 DIAGNOSIS — G8929 Other chronic pain: Secondary | ICD-10-CM

## 2022-05-04 DIAGNOSIS — M5442 Lumbago with sciatica, left side: Secondary | ICD-10-CM | POA: Diagnosis not present

## 2022-05-04 DIAGNOSIS — R293 Abnormal posture: Secondary | ICD-10-CM

## 2022-05-04 DIAGNOSIS — R2689 Other abnormalities of gait and mobility: Secondary | ICD-10-CM

## 2022-05-04 DIAGNOSIS — M6281 Muscle weakness (generalized): Secondary | ICD-10-CM

## 2022-05-04 NOTE — Therapy (Signed)
OUTPATIENT PHYSICAL THERAPY THORACOLUMBAR TREATMENT   Patient Name: Alyssa Hansen MRN: [id number] DOB:01-25-1990, 32 y.o., female Today's Date: 05/04/2022  END OF SESSION:  PT End of Session - 05/04/22 0946     Visit Number 5    Date for PT Re-Evaluation 05/12/22    Authorization Type [employer] - requesting 10 visits 1-2x/week from 03/19/22 to 05/12/22    PT Start Time 0948    PT Stop Time 1028    PT Time Calculation (min) 40 min    Behavior During Therapy [facility] for tasks assessed/performed              Past Medical History:  Diagnosis Date   ADHD (attention deficit hyperactivity disorder)    ADHD (attention deficit hyperactivity disorder)    ADHD (attention deficit hyperactivity disorder)    Anxiety    Bipolar 1 disorder, depressed (HCC)    Chlamydia    Depression    Drug abuse (HCC)    Herpes    + blood test, never had outbreak   History of suicidal tendencies    Hypothyroidism    No meds.   Obesity    PCOS (polycystic ovarian syndrome)    Restless legs    Scoliosis    Substance abuse (HCC)    Trichomonas    Past Surgical History:  Procedure Laterality Date   CESAREAN SECTION     LAPAROSCOPIC ABDOMINAL EXPLORATION     Patient Active Problem List   Diagnosis Date Noted   Encounter for long-term (current) use of high-risk medication 04/05/2022   Right carpal tunnel syndrome 03/04/2022   Restless legs 03/04/2022   Chronic pain syndrome 03/01/2022   Morbid obesity due to excess calories (HCC) 02/01/2021   Tobacco user 02/01/2021   OSA (obstructive sleep apnea) 03/19/2020   Opioid dependence (HCC) 04/15/2012   Cocaine abuse (HCC) 04/15/2012   Opioid use with withdrawal (HCC) 04/15/2012   Substance induced mood disorder (HCC) 04/15/2012   LOW BACK PAIN 08/02/2007    PCP: no PCP  REFERRING PROVIDER: Angelina Sheriff, DO  REFERRING DIAG: G89.4 (ICD-10-CM) - Chronic pain syndrome M54.42,G89.29 (ICD-10-CM) - Chronic bilateral low back pain with left-sided  sciatica  Rationale for Evaluation and Treatment: Rehabilitation  THERAPY DIAG:  Chronic left-sided low back pain with left-sided sciatica  Abnormal posture  Other abnormalities of gait and mobility  Muscle weakness (generalized)  ONSET DATE: chronic  SUBJECTIVE:  SUBJECTIVE STATEMENT: 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   She reports she is thinking of quitting, as it is not worth the money if she can't do anything outside of work with her pain so high.   PERTINENT HISTORY:  Per chart review, note from referring MD: ADHD, Anxiety, Bipolar 1 disorder, depressed (HCC), Depression, Drug abuse (HCC), History of suicidal tendencies, Hypothyroidism, Obesity, PCOS (polycystic ovarian syndrome), Restless legs, Scoliosis, Substance abuse (HCC), and Trichomonas.     PAIN:  PAIN:  Are you having pain? Yes NPRS scale: 7/10 Pain location: LBP central and spreads bil, Lt>Rt throbbing and sharp shooting pains Pain orientation: Bilateral  PAIN TYPE: aching, sharp, and throbbing Pain description: constant  Aggravating factors: standing and walking, all physical movement Relieving factors: oxycodone and laying down, ice, TENS unit   PRECAUTIONS: None  WEIGHT BEARING RESTRICTIONS: No  FALLS:  Has patient fallen in last 6 months? Yes. Number of falls 1 in Oct, climbing on ladder, fell 3 feet onto concrete  LIVING ENVIRONMENT: Lives with: lives with their family - husband and 3 kids ages 97-14 Lives in: House/apartment Stairs: Yes: External: 4 steps; none Has following equipment at home: Single point cane, shower bench  OCCUPATION: stay at home mom  PLOF: Independent  PATIENT GOALS: try aquatic PT b/c land-based PT has worsened pain in past  NEXT MD VISIT:  April 1  OBJECTIVE:   DIAGNOSTIC FINDINGS:  Intact bone structure low back per Pt - has had updated MRI and CT scan but not in chart since 2018  PATIENT SURVEYS:  Modified Oswestry 32/50   SCREENING FOR RED FLAGS: Bowel or bladder incontinence: No Spinal tumors: No Cauda equina syndrome: No Compression fracture: No Abdominal aneurysm: No  COGNITION: Overall cognitive status: Within functional limits for tasks assessed     SENSATION: Both legs go numb with sitting > 20-30 min  MUSCLE LENGTH: Hamstrings: 50% bil Thomas test: 50% bil  POSTURE: rounded shoulders, forward head, increased lumbar lordosis, increased thoracic kyphosis, and anterior pelvic tilt  PALPATION: Global tenderness, tight QL and lumbar paraspinals bil, tight upper traps and pecs  CERVICAL ROM: Starts from signif forward head with extension hinge Bil rotation 60 deg with contralateral pain Flexion 40 deg Extension 12 deg  LUMBAR ROM:   AROM eval  Flexion Fingers to midshin, if bends knees gets relief  Extension Not tested  Right lateral flexion 75%  Left lateral flexion 60%  Right rotation 30%  Left rotation 30%   (Blank rows = not tested)  LOWER EXTREMITY ROM:     WFL  LOWER EXTREMITY MMT:   Bil shoulders 4-/5 Bil hips 4-/5 Bil knees 4/5   FUNCTIONAL TESTS:  5 times sit to stand: 22.45 sec Timed up and go (TUG): 14.23 sec 3 minute walk test: 398 feet, pain up to 7/10, SOB  GAIT: Distance walked: 398 Assistive device utilized: None Level of assistance: Complete Independence Comments: short stride length, lacks trunk rotation and arm swing  TODAY'S TREATMENT:  DATE: 05/04/22  Pt seen for aquatic therapy today.  Treatment took place in water 3.5-4.75 ft in depth at the [facility] pool. Temp of water was 91.  Pt entered/exited the pool via stairs with  step-to pattern with bilat rail.   Walking unsupported forward/ backward for 6 min, followed by LE dangling with UE on noodle side stepping (with arm abdct/ addct) 2 laps Plank with hands on bench in water, mini alternating hip ext x 5 each LE L stretch x 20 seconds Staggered stance with yellow hand floats: tricep press down x 5, each leg forward;  bilat horiz abdct/addct x 10 each LE forward; addct/abdct x 10 Return to walking unsupported forward/ backward  Yellow noodle under arms and dangling LE for decompression -> switched to noodle between legs, with cycling and UE breast stroke L stretch x 20 seconds Pt requires the buoyancy and hydrostatic pressure of water for support, and to offload joints by unweighting joint load by at least 50 % in navel deep water and by at least 75-80% in chest to neck deep water.  Viscosity of the water is needed for resistance of strengthening. Water current perturbations provides challenge to standing balance requiring increased core activation.  PATIENT EDUCATION:  Education details: aquatic therapy exercise progressions/ modifications  Person educated: Patient Education method: Chief Technology Officer Education comprehension: verbalized understanding  HOME EXERCISE PROGRAM: Will do aquatic PT 2x/week then reassess for readiness of HEP  ASSESSMENT:  CLINICAL IMPRESSION: Pt tolerated walking in water for 6 min, with a slight decrease in pain to 5/10. Pt has met STG 1-3.   Overall pain remained elevated at 5/10 remainder of session.  Physically demanding job continues to affect pt's pain levels.     FROM EVAL: Patient is a 32 y.o. female who was seen today for physical therapy evaluation and treatment for chronic pain syndrome and chronic LBP with bil sciatica Lt>Rt.  Pt is specifically referred to try aquatic PT b/c land-based PT has worsened her symptoms.  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  She has constant pain made worse by all  physical movement.  Pain has been present since age 34 and worsened over time.  Pain ranges from 5-9/10.  She reports bil leg numbness with sitting 20-30 min and sharp shooting pains into legs with standing and walking.  She uses a walking stick when running errands.  She has limited mobility and ROM throughout trunk and proximal joints.  Body habitus contributes to ROM limitations.  She has 4/5 strength throughout UE/LE with pain on testing.  She was able to participate in today covering 398' without AD but pain spiked to 7/10 with this.  She was tearful throughout session secondary to pain with physical testing.  ODI score is 32/50 falling in severe disability category.  Pt will benefit from aquatic environment to initiate mobility, stretching, strengthening and endurance given high pain levels and limited tolerance of land-based movement.  OBJECTIVE IMPAIRMENTS: Abnormal gait, decreased activity tolerance, decreased balance, decreased mobility, decreased ROM, decreased strength, hypomobility, increased fascial restrictions, increased muscle spasms, impaired flexibility, impaired sensation, impaired tone, impaired UE functional use, improper body mechanics, postural dysfunction, obesity, and pain.   ACTIVITY LIMITATIONS: carrying, lifting, bending, standing, squatting, sleeping, stairs, transfers, bed mobility, bathing, dressing, reach over head, and locomotion level  PARTICIPATION LIMITATIONS: meal prep, cleaning, laundry, shopping, community activity, and yard work  PERSONAL FACTORS: Time since onset of injury/illness/exacerbation and 1-2 comorbidities: obesity,  are also affecting patient's functional outcome.   REHAB POTENTIAL: Good  CLINICAL DECISION MAKING: Stable/uncomplicated  EVALUATION COMPLEXITY: Low   GOALS: Goals reviewed with patient? Yes  SHORT TERM GOALS: Target date: 04/14/22  Pt will be able to participate in aquatic PT for at least 25 min of active mobility of trunk,  LE, UE without exacerbation of pain Baseline: Goal status:MET -04/22/22  2.  Pt will be able to perform a in the pool without exacerbation of pain Baseline:  Goal status:MET -05/04/22  3.  Pt will learn proper activations of deep core, scapular stabilizers, postural support muscles and LE prime movers in an aquatic environment to build strength and promote ROM with improved alignment. Baseline:  Goal status: MET -05/04/22    LONG TERM GOALS: Target date: 05/12/22  Pt will improve to cover at least 450' with pain not to exceed 6/10 Baseline:  Goal status: INITIAL  2.  Pt will explore option to include LRAD to improve land-based tolerance of walking for exercise and community outings/errands Baseline:  Goal status: INITIAL  3.  Pt will improve ODI score by at least 8 points to improve disability level from severe to moderate. Baseline: 32/50 Goal status: INITIAL  4.  Pt will improve UE and LE strength to at least 4+/5 to improve tolerance of daily household tasks. Baseline:  Goal status: INITIAL  5.  Pt will report improved tolerance of standing tasks for up to 20 min for light household tasks. Baseline:  Goal status: INITIAL  6.  Pt will learn land-based stretches and strengthening exercises she can perform for HEP after a round of aquatic PT. Baseline:  Goal status: INITIAL  PLAN:  PT FREQUENCY:1-2x/week for 9 aquatic visits and 10th visit land-based re-evaluation  PT DURATION: 8 weeks  PLANNED INTERVENTIONS: Therapeutic exercises, Therapeutic activity, Neuromuscular re-education, Balance training, Gait training, Patient/Family education, Self Care, and Aquatic Therapy.  PLAN FOR NEXT SESSION: continue aquatic PT - work on general mobility of trunk/UE/LE, postural strength, gait endurance for chronic pain and deconditioning  Mayer Camel, PTA 05/04/22 10:28 AM [facility] [address] [place],  [place], [address] Phone: [phone]   Fax:  [phone]

## 2022-05-06 ENCOUNTER — Encounter (HOSPITAL_BASED_OUTPATIENT_CLINIC_OR_DEPARTMENT_OTHER): Payer: Self-pay | Admitting: Physical Therapy

## 2022-05-06 ENCOUNTER — Ambulatory Visit (HOSPITAL_BASED_OUTPATIENT_CLINIC_OR_DEPARTMENT_OTHER): Payer: Medicaid Other | Attending: Physical Medicine and Rehabilitation | Admitting: Physical Therapy

## 2022-05-06 DIAGNOSIS — R2689 Other abnormalities of gait and mobility: Secondary | ICD-10-CM

## 2022-05-06 DIAGNOSIS — G8929 Other chronic pain: Secondary | ICD-10-CM

## 2022-05-06 DIAGNOSIS — R293 Abnormal posture: Secondary | ICD-10-CM

## 2022-05-06 DIAGNOSIS — M6281 Muscle weakness (generalized): Secondary | ICD-10-CM

## 2022-05-06 DIAGNOSIS — M5442 Lumbago with sciatica, left side: Secondary | ICD-10-CM | POA: Insufficient documentation

## 2022-05-06 NOTE — Therapy (Signed)
OUTPATIENT PHYSICAL THERAPY THORACOLUMBAR TREATMENT   Patient Name: Alyssa Hansen MRN: [id number] DOB:04/19/1990, 32 y.o., female Today's Date: 05/06/2022  END OF SESSION:  PT End of Session - 05/06/22 1001     Visit Number 6    Date for PT Re-Evaluation 05/12/22    Authorization Type [employer] - requesting 10 visits 1-2x/week from 03/19/22 to 05/12/22    PT Start Time 0957   pt arrived late   PT Stop Time 1028    PT Time Calculation (min) 31 min    Behavior During Therapy [facility] for tasks assessed/performed              Past Medical History:  Diagnosis Date   ADHD (attention deficit hyperactivity disorder)    ADHD (attention deficit hyperactivity disorder)    ADHD (attention deficit hyperactivity disorder)    Anxiety    Bipolar 1 disorder, depressed (HCC)    Chlamydia    Depression    Drug abuse (HCC)    Herpes    + blood test, never had outbreak   History of suicidal tendencies    Hypothyroidism    No meds.   Obesity    PCOS (polycystic ovarian syndrome)    Restless legs    Scoliosis    Substance abuse (HCC)    Trichomonas    Past Surgical History:  Procedure Laterality Date   CESAREAN SECTION     LAPAROSCOPIC ABDOMINAL EXPLORATION     Patient Active Problem List   Diagnosis Date Noted   Encounter for long-term (current) use of high-risk medication 04/05/2022   Right carpal tunnel syndrome 03/04/2022   Restless legs 03/04/2022   Chronic pain syndrome 03/01/2022   Morbid obesity due to excess calories (HCC) 02/01/2021   Tobacco user 02/01/2021   OSA (obstructive sleep apnea) 03/19/2020   Opioid dependence (HCC) 04/15/2012   Cocaine abuse (HCC) 04/15/2012   Opioid use with withdrawal (HCC) 04/15/2012   Substance induced mood disorder (HCC) 04/15/2012   LOW BACK PAIN 08/02/2007    PCP: no PCP  REFERRING PROVIDER: Angelina Sheriff, DO  REFERRING DIAG: G89.4 (ICD-10-CM) - Chronic pain syndrome M54.42,G89.29 (ICD-10-CM) - Chronic bilateral low back pain  with left-sided sciatica  Rationale for Evaluation and Treatment: Rehabilitation  THERAPY DIAG:  Chronic left-sided low back pain with left-sided sciatica  Abnormal posture  Other abnormalities of gait and mobility  Muscle weakness (generalized)  ONSET DATE: chronic  SUBJECTIVE:  SUBJECTIVE STATEMENT: Pt reports she hasn't worked over the last few days and has rested so her pain is not as bad as last visit. She states she is unsure if the aquatic therapy is helping.      PERTINENT HISTORY:  Per chart review, note from referring MD: ADHD, Anxiety, Bipolar 1 disorder, depressed (HCC), Depression, Drug abuse (HCC), History of suicidal tendencies, Hypothyroidism, Obesity, PCOS (polycystic ovarian syndrome), Restless legs, Scoliosis, Substance abuse (HCC), and Trichomonas.     PAIN:  PAIN:  Are you having pain? Yes NPRS scale: 5/10 Pain location: LBP central and spreads bil, Lt>Rt throbbing and sharp shooting pains Pain orientation: Bilateral  PAIN TYPE: aching, sharp, and throbbing Pain description: constant  Aggravating factors: standing and walking, all physical movement Relieving factors: oxycodone and laying down, ice, TENS unit   PRECAUTIONS: None  WEIGHT BEARING RESTRICTIONS: No  FALLS:  Has patient fallen in last 6 months? Yes. Number of falls 1 in Oct, climbing on ladder, fell 3 feet onto concrete  LIVING ENVIRONMENT: Lives with: lives with their family - husband and 3 kids ages 54-14 Lives in: House/apartment Stairs: Yes: External: 4 steps; none Has following equipment at home: Single point cane, shower bench  OCCUPATION: stay at home mom  PLOF: Independent  PATIENT GOALS: try aquatic PT b/c land-based PT has worsened pain in past  NEXT MD VISIT: April 1  OBJECTIVE:    DIAGNOSTIC FINDINGS:  Intact bone structure low back per Pt - has had updated MRI and CT scan but not in chart since 2018  PATIENT SURVEYS:  Modified Oswestry 32/50   SCREENING FOR RED FLAGS: Bowel or bladder incontinence: No Spinal tumors: No Cauda equina syndrome: No Compression fracture: No Abdominal aneurysm: No  COGNITION: Overall cognitive status: Within functional limits for tasks assessed     SENSATION: Both legs go numb with sitting > 20-30 min  MUSCLE LENGTH: Hamstrings: 50% bil Thomas test: 50% bil  POSTURE: rounded shoulders, forward head, increased lumbar lordosis, increased thoracic kyphosis, and anterior pelvic tilt  PALPATION: Global tenderness, tight QL and lumbar paraspinals bil, tight upper traps and pecs  CERVICAL ROM: Starts from signif forward head with extension hinge Bil rotation 60 deg with contralateral pain Flexion 40 deg Extension 12 deg  LUMBAR ROM:   AROM eval  Flexion Fingers to midshin, if bends knees gets relief  Extension Not tested  Right lateral flexion 75%  Left lateral flexion 60%  Right rotation 30%  Left rotation 30%   (Blank rows = not tested)  LOWER EXTREMITY ROM:     WFL  LOWER EXTREMITY MMT:   Bil shoulders 4-/5 Bil hips 4-/5 Bil knees 4/5   FUNCTIONAL TESTS:  5 times sit to stand: 22.45 sec Timed up and go (TUG): 14.23 sec 3 minute walk test: 398 feet, pain up to 7/10, SOB  GAIT: Distance walked: 398 Assistive device utilized: None Level of assistance: Complete Independence Comments: short stride length, lacks trunk rotation and arm swing  TODAY'S TREATMENT:  DATE: 05/06/22  Pt seen for aquatic therapy today.  Treatment took place in water 3.5-4.75 ft in depth at the [facility] pool. Temp of water was 91.  Pt entered/exited the pool via stairs with step-to pattern with  bilat rail.  Walking unsupported forward/ backward, followed by Rt SLS to off load LLE side stepping (with arm abdct/ addct) 2 laps Holding wall: heel raises -x 5 stopped due to increased LLE pain  Cycling with noodle between LEs,  gentle cc ski, gentle suspended jumping jack LEs L stretch x 20 seconds Holding wall:  alternating hip openers (hip abdct/knee flexion); alternating 1/2 diamonds x 5 each Return to walking backward/forward with reciprocal arm swing Yellow noodle under arms and dangling LE for decompression  PATIENT EDUCATION:  Education details: aquatic therapy exercise progressions/ modifications  Person educated: Patient Education method: Chief Technology Officer Education comprehension: verbalized understanding  HOME EXERCISE PROGRAM: Will do aquatic PT 2x/week then reassess for readiness of HEP  ASSESSMENT:  CLINICAL IMPRESSION: Pt continues to have very limited tolerance for aquatic exercise. She reports increase in radicular symptoms today with bilat heel raises after 5 reps and when in L SLS with gentle Rt LE swings. Pt reports her only relief is when her BLE are suspended with assistance of noodle under her arms.   Recommend pt return to supervising PT for reassessment, with possible return to MD for further testing.   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       FROM EVAL: Patient is a 32 y.o. female who was seen today for physical therapy evaluation and treatment for chronic pain syndrome and chronic LBP with bil sciatica Lt>Rt.  Pt is specifically referred to try aquatic PT b/c land-based PT has worsened her symptoms.  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  She has constant pain made worse by all physical movement.  Pain has been present since age 52 and worsened over time.  Pain ranges from 5-9/10.  She reports bil leg numbness with sitting 20-30 min and sharp shooting pains into  legs with standing and walking.  She uses a walking stick when running errands.  She has limited mobility and ROM throughout trunk and proximal joints.  Body habitus contributes to ROM limitations.  She has 4/5 strength throughout UE/LE with pain on testing.  She was able to participate in today covering 398' without AD but pain spiked to 7/10 with this.  She was tearful throughout session secondary to pain with physical testing.  ODI score is 32/50 falling in severe disability category.  Pt will benefit from aquatic environment to initiate mobility, stretching, strengthening and endurance given high pain levels and limited tolerance of land-based movement.  OBJECTIVE IMPAIRMENTS: Abnormal gait, decreased activity tolerance, decreased balance, decreased mobility, decreased ROM, decreased strength, hypomobility, increased fascial restrictions, increased muscle spasms, impaired flexibility, impaired sensation, impaired tone, impaired UE functional use, improper body mechanics, postural dysfunction, obesity, and pain.   ACTIVITY LIMITATIONS: carrying, lifting, bending, standing, squatting, sleeping, stairs, transfers, bed mobility, bathing, dressing, reach over head, and locomotion level  PARTICIPATION LIMITATIONS: meal prep, cleaning, laundry, shopping, community activity, and yard work  PERSONAL FACTORS: Time since onset of injury/illness/exacerbation and 1-2 comorbidities: obesity,   are also affecting patient's functional outcome.   REHAB POTENTIAL: Good  CLINICAL DECISION MAKING: Stable/uncomplicated  EVALUATION COMPLEXITY: Low   GOALS: Goals reviewed with patient? Yes  SHORT TERM GOALS: Target date: 04/14/22  Pt will be able to participate in aquatic PT for at least 25 min of active mobility of trunk, LE, UE without exacerbation of pain Baseline: Goal status:MET -04/22/22  2.  Pt will be able to perform a in the pool without exacerbation of pain Baseline:  Goal status:MET  -05/04/22  3.  Pt will learn proper activations of deep core, scapular stabilizers, postural support muscles and LE prime movers in an aquatic environment to build strength and promote ROM with improved alignment. Baseline:  Goal status: MET -05/04/22    LONG TERM GOALS: Target date: 05/12/22  Pt will improve to cover at least 450' with pain not to exceed 6/10 Baseline:  Goal status: INITIAL  2.  Pt will explore option to include LRAD to improve land-based tolerance of walking for exercise and community outings/errands Baseline:  Goal status: INITIAL  3.  Pt will improve ODI score by at least 8 points to improve disability level from severe to moderate. Baseline: 32/50 Goal status: INITIAL  4.  Pt will improve UE and LE strength to at least 4+/5 to improve tolerance of daily household tasks. Baseline:  Goal status: INITIAL  5.  Pt will report improved tolerance of standing tasks for up to 20 min for light household tasks. Baseline:  Goal status: INITIAL  6.  Pt will learn land-based stretches and strengthening exercises she can perform for HEP after a round of aquatic PT. Baseline:  Goal status: INITIAL  PLAN:  PT FREQUENCY:1-2x/week for 9 aquatic visits and 10th visit land-based re-evaluation  PT DURATION: 8 weeks  PLANNED INTERVENTIONS: Therapeutic exercises, Therapeutic activity, Neuromuscular re-education, Balance training, Gait training, Patient/Family education, Self Care, and Aquatic Therapy.  PLAN FOR NEXT SESSION: continue aquatic PT - work on general mobility of trunk/UE/LE, postural strength, gait endurance for chronic pain and deconditioning  Mayer Camel, PTA 05/06/22 1:08 PM [facility] [address] [place], [place], [address] Phone: [phone]   Fax:  [phone]

## 2022-05-11 ENCOUNTER — Ambulatory Visit (HOSPITAL_BASED_OUTPATIENT_CLINIC_OR_DEPARTMENT_OTHER): Payer: Medicaid Other | Admitting: Physical Therapy

## 2022-05-12 ENCOUNTER — Ambulatory Visit: Payer: Medicaid Other | Admitting: Physical Therapy

## 2022-05-13 ENCOUNTER — Ambulatory Visit (HOSPITAL_BASED_OUTPATIENT_CLINIC_OR_DEPARTMENT_OTHER): Payer: Medicaid Other | Admitting: Physical Therapy

## 2022-05-13 ENCOUNTER — Ambulatory Visit: Payer: Medicaid Other | Admitting: Dietician

## 2022-05-17 MED ORDER — OXYCODONE HCL 10 MG PO TABS: 10.0000 mg | ORAL_TABLET | ORAL | 0 refills | Status: DC | PRN

## 2022-05-17 MED ORDER — OXYCODONE HCL 10 MG PO TABS: 10.0000 mg | ORAL_TABLET | ORAL | 0 refills | Status: AC | PRN

## 2022-05-17 NOTE — Addendum Note (Signed)
Addended by: Elijah Birk on: 05/17/2022 01:16 PM   Modules accepted: Orders

## 2022-05-18 ENCOUNTER — Ambulatory Visit (HOSPITAL_BASED_OUTPATIENT_CLINIC_OR_DEPARTMENT_OTHER): Payer: Medicaid Other | Admitting: Physical Therapy

## 2022-05-20 ENCOUNTER — Ambulatory Visit (HOSPITAL_BASED_OUTPATIENT_CLINIC_OR_DEPARTMENT_OTHER): Payer: Medicaid Other | Admitting: Physical Therapy

## 2022-05-26 ENCOUNTER — Ambulatory Visit: Payer: Medicaid Other | Admitting: Nutrition

## 2022-05-26 ENCOUNTER — Ambulatory Visit: Payer: Medicaid Other | Admitting: Physical Therapy

## 2022-06-07 ENCOUNTER — Encounter: Payer: Medicaid Other | Admitting: Physical Medicine and Rehabilitation

## 2022-06-09 ENCOUNTER — Encounter: Payer: Medicaid Other | Attending: Physical Medicine & Rehabilitation | Admitting: Registered Nurse

## 2022-06-09 DIAGNOSIS — M545 Low back pain, unspecified: Secondary | ICD-10-CM | POA: Insufficient documentation

## 2022-06-22 ENCOUNTER — Encounter: Payer: Self-pay | Admitting: Physical Medicine & Rehabilitation

## 2022-06-22 ENCOUNTER — Encounter: Payer: Medicaid Other | Admitting: Physical Medicine & Rehabilitation

## 2022-06-22 VITALS — BP 125/78 | HR 107 | Ht 64.0 in | Wt 306.0 lb

## 2022-06-22 DIAGNOSIS — M545 Low back pain, unspecified: Secondary | ICD-10-CM

## 2022-06-22 NOTE — Progress Notes (Signed)
Subjective:    Patient ID: Alyssa Hansen, female    DOB: [birth year], 32 y.o.   MRN: [id number]  HPI CC:  Low back pain since age [age] year old female with history of polysubstance abuse, morbid obesity and chronic low back pain Referred by primary physiatrist  evaluate for possible lumbar spine injection. Worsening pain over the last 3 years .   Pain is mainly axial although she sometimes feels pain in her legs.  She also describes pain in her arms on occasion as well.  Her arm pain is probably on the right where the leg pain is bilateral. Pain severity is in around 7-8 range.  She generally does better with sitting then with standing although prolonged sitting can also make her back hurt. The patient states that she has undergone physical therapy for the same problem relief In the back of regular exercise.  She is independent with all self-care and mobility. Pain Inventory Average Pain 7 Pain Right Now 8 My pain is constant, sharp, burning, stabbing, tingling, and aching  In the last 24 hours, has pain interfered with the following? General activity 8 Relation with others 8 Enjoyment of life 9 What TIME of day is your pain at its worst? morning , daytime, and evening Sleep (in general) Fair  Pain is worse with: walking, bending, sitting, inactivity, standing, and some activites Pain improves with: rest, heat/ice, medication, and TENS Relief from Meds: 6  Family History  Problem Relation Age of Onset   Hypertension Father    Hypertension Maternal Grandmother    Hypertension Maternal Grandfather    Heart disease Maternal Grandfather    Diabetes Maternal Grandfather    Alcohol abuse Neg Hx    Arthritis Neg Hx    Asthma Neg Hx    Birth defects Neg Hx    Cancer Neg Hx    Depression Neg Hx    COPD Neg Hx    Drug abuse Neg Hx    Early death Neg Hx    Hearing loss Neg Hx    Social History   Socioeconomic History   Marital status: Single    Spouse name: Not on file    Number of children: Not on file   Years of education: Not on file   Highest education level: Not on file  Occupational History   Not on file  Tobacco Use   Smoking status: Every Day    Packs/day: 1    Types: Cigarettes   Smokeless tobacco: Never   Tobacco comments:    1/2 pack a day  Vaping Use   Vaping Use: Former  Substance and Sexual Activity   Alcohol use: No   Drug use: Yes    Types: Oxycodone, Cocaine, Marijuana    Comment: denies 11/08/20   Sexual activity: Not on file  Other Topics Concern   Not on file  Social History Narrative   Not on file   Social Determinants of Health   Financial Resource Strain: Not on file  Food Insecurity: Not on file  Transportation Needs: Not on file  Physical Activity: Not on file  Stress: Not on file  Social Connections: Not on file   Past Surgical History:  Procedure Laterality Date   CESAREAN SECTION     LAPAROSCOPIC ABDOMINAL EXPLORATION     Past Surgical History:  Procedure Laterality Date   CESAREAN SECTION     LAPAROSCOPIC ABDOMINAL EXPLORATION     Past Medical History:  Diagnosis Date   ADHD (attention  deficit hyperactivity disorder)    ADHD (attention deficit hyperactivity disorder)    ADHD (attention deficit hyperactivity disorder)    Anxiety    Bipolar 1 disorder, depressed (HCC)    Chlamydia    Depression    Drug abuse (HCC)    Herpes    + blood test, never had outbreak   History of suicidal tendencies    Hypothyroidism    No meds.   Obesity    PCOS (polycystic ovarian syndrome)    Restless legs    Scoliosis    Substance abuse (HCC)    Trichomonas    BP 125/78   Pulse (!) 107   Ht 5\' 4"  (1.626 m)   Wt (!) 306 lb (138.8 kg)   SpO2 97%   BMI 52.52 kg/m   Opioid Risk Score:   Fall Risk Score:  `1  Depression screen [facility] 2/9     04/28/2022    1:54 PM 04/05/2022    9:31 AM 03/01/2022   10:43 AM  Depression screen PHQ 2/9  Decreased Interest 0 1 3  Down, Depressed, Hopeless 0 1 3  PHQ - 2 Score  0 2 6  Altered sleeping   2  Tired, decreased energy   3  Change in appetite   3  Feeling bad or failure about yourself    2  Trouble concentrating   1  Moving slowly or fidgety/restless   0  Suicidal thoughts   1  PHQ-9 Score   18    Review of Systems  Musculoskeletal:  Positive for back pain and neck pain.       Pain in both legs, right arm & hand  All other systems reviewed and are negative.      Objective:   Physical Exam  General no acute distress Mood and affect anxious Motor strength is 5/5 bilateral deltoid, bicep, knee extensor, Negative straight leg raising bilaterally Sensation intact light touch bilateral lower extremities Negative Faber's bilaterally Negative thigh thrust bilaterally Negative hip distraction test Positive prone compression bilateral PSIS  Ambulates without assistive device no evidence of toe drag or knee instability Lumbar range of motion is limited 50% flexion extension lateral bending.  She has more pain with extension than with flexion.     Assessment & Plan:   #1.  Chronic low back pain axial improved with sitting increased with standing and extension.  X-rays from 2018 reviewed these look relatively unremarkable.  Given her body habitus would be at risk for increased load on the posterior elements.  Repeat x-rays, schedule for diagnostic medial branch blocks.  She has undergone conservative care including physical therapy.  She is on narcotic analgesic medications for this prescribed Pain has been ongoing for many years but worsened over the last 3 years.  Pain is rated as severe in the 7 or 8/10 range

## 2022-07-09 ENCOUNTER — Ambulatory Visit: Payer: Medicaid Other | Admitting: Physical Medicine & Rehabilitation

## 2022-07-14 ENCOUNTER — Encounter: Payer: Medicaid Other | Attending: Physical Medicine & Rehabilitation | Admitting: Physical Medicine and Rehabilitation

## 2022-07-14 ENCOUNTER — Encounter: Payer: Self-pay | Admitting: Physical Medicine and Rehabilitation

## 2022-07-14 VITALS — BP 175/92 | HR 98 | Ht 64.0 in | Wt 311.0 lb

## 2022-07-14 DIAGNOSIS — M5440 Lumbago with sciatica, unspecified side: Secondary | ICD-10-CM | POA: Diagnosis present

## 2022-07-14 DIAGNOSIS — M48062 Spinal stenosis, lumbar region with neurogenic claudication: Secondary | ICD-10-CM | POA: Diagnosis present

## 2022-07-14 DIAGNOSIS — G894 Chronic pain syndrome: Secondary | ICD-10-CM | POA: Insufficient documentation

## 2022-07-14 DIAGNOSIS — R5382 Chronic fatigue, unspecified: Secondary | ICD-10-CM | POA: Diagnosis present

## 2022-07-14 DIAGNOSIS — M545 Low back pain, unspecified: Secondary | ICD-10-CM | POA: Diagnosis present

## 2022-07-14 MED ORDER — OXYCODONE HCL 10 MG PO TABS: 10.0000 mg | ORAL_TABLET | ORAL | 0 refills | Status: DC | PRN

## 2022-07-14 MED ORDER — BACLOFEN 10 MG PO TABS: 10.0000 mg | ORAL_TABLET | Freq: Three times a day (TID) | ORAL | 0 refills | Status: DC | PRN

## 2022-07-14 MED ORDER — OXYCODONE HCL 10 MG PO TABS: 10.0000 mg | ORAL_TABLET | ORAL | 0 refills | Status: AC | PRN

## 2022-07-14 MED ORDER — PREGABALIN 150 MG PO CAPS: 150.0000 mg | ORAL_CAPSULE | Freq: Two times a day (BID) | ORAL | 3 refills | Status: DC

## 2022-07-14 NOTE — Patient Instructions (Addendum)
Follow up in 3 months for pain management  Keep appointment with Dr. Wynn Banker for injections  Increase Lyrica to 150 mg twice daily  Stop skelaxin. Use baclofen as needed for muscle spasms. Continue magnesium supplements if they have bene helpful.  I will look into concurrent prescribing of suboxone with oxycodone for pain control; I will try To get back to you within the next month about options.   I will see you in 3 months for new scripts and management  For sleep, I want you to pick a time to lay down every night, ideally between 8 and 10 PM.    Starting 1 hour before you want to go to sleep, turn off all television screens, phone screens, tablets, and computers.    Keep the lights low and perform only low stimulation activities, such as reading.    Only use your bedroom for sleep and sex.

## 2022-07-14 NOTE — Progress Notes (Signed)
Subjective:    Patient ID: [name], female    DOB: [birth year], 32 y.o.   MRN: [id number]  HPI   Alyssa Hansen is a 32 y.o. year old female  who  has a past medical history of ADHD (attention deficit hyperactivity disorder), ADHD (attention deficit hyperactivity disorder), ADHD (attention deficit hyperactivity disorder), Anxiety, Bipolar 1 disorder, depressed (HCC), Chlamydia, Depression, Drug abuse (HCC), Herpes, History of suicidal tendencies, Hypothyroidism, Obesity, PCOS (polycystic ovarian syndrome), Restless legs, Scoliosis, Substance abuse (HCC), and Trichomonas.    They are presenting to PM&R clinic for chronic pain related to lumbosacral back pain .   Plan from last visit: Chronic pain syndrome Assessment & Plan: Today, we performed a urine drug screen and signed a controlled substance contract.  If results are as expected, I will refill your oxycodone for 3 months.  In the future, we can talk about transitioning to methadone, but I will not increase the dose of oxycodone at this time.   Indication for chronic opioid: Low Back pain Medication and dose: Oxycodone 10 mg Q4H PRN # pills per month: 180 Last UDS date: 04/05/22 Opioid Treatment Agreement signed (Y/N): Y, 03/04/22 Opioid Treatment Agreement last reviewed with patient:   NCCSRS/PDMP reviewed this encounter (include red flags): Yes    Management will include: Medium Risk (10-90 MME) UDS every 3-6 months NCCSR check every visit Follow up Q1M initially, Q3M once established     Orders: -     ToxAssure Select,+Antidepr,UR   Encounter for therapeutic drug monitoring -     Optometrist for long-term (current) use of high-risk medication -     ToxAssure Select,+Antidepr,UR   Right carpal tunnel syndrome Assessment & Plan: Continue nighttime braces for at least 6 weeks; patient states she is seeing benefit Lyrica increased      Midline low back pain with sciatica, sciatica  laterality unspecified, unspecified chronicity Assessment & Plan: We did increase your dose of Lyrica today to 100 mg twice a day.   I refilled your lidocaine patches   Please start your aqua therapy.  I will follow-up with you in 2 months.  I will also get you on the schedule for possible epidural steroid injections with Dr. Wynn Banker in 3 months, and if it 24-month follow-up things or not improving, we can get imaging of your back in anticipation of that appointment.   Please call the clinic if any questions, concerns.     Other orders -     Pregabalin; Take 1 capsule (100 mg total) by mouth 2 (two) times daily.  Dispense: 60 capsule; Refill: 3 -     Lidocaine; Place 2 patches onto the skin daily.  Dispense: 30 patch; Refill: 5     Interval Hx:  - Therapies: Stopped PT because they were not seeing improvement. She says they did the same things every time; she got into the pool and she would walk back and forth, then do pushups on the walls, and do bicycles in the water.   "When I get into the water, I can feel my back release and it feels really good." She goes to the river sometimes to float but does not have a pool membership and cannot go everyday.    - Follow ups: Saw Dr. Wynn Banker 6/18: 1.  Chronic low back pain axial improved with sitting increased with standing and extension.  X-rays from 2018 reviewed these look relatively unremarkable.  Given her body habitus would be at  risk for increased load on the posterior elements.  Repeat x-rays, schedule for diagnostic medial branch blocks.  She has undergone conservative care including physical therapy.  She is on narcotic analgesic medications for this prescribed. Scheduled for August 1st as long as insurance approves it.    - Falls: Larey Seat on June 1st; was walking her son to school and her flip flop twisted, she landed on the concrete walkway on her left side and then onto her back. Did not hit her head or lose consciousness. Never got  evalauted; she was generally hurting worse but did not feel anything was broken.   In may, she was sweeping in the kitchen when her legs suddenly went out from under her and she fell. She says the symptoms into her legs have been getting worse, stabbing and aching if bearing weight and throbbing if seated. Extends from her waist, lateral thighs and occasionally extends down into her calves. Usually left is worse but right has been increasing. Some mild numbness/tingling in her butt with sitting, no groin numbness, no incontinence.    - DME: Has a single point cane that she uses occasionally; PT wanted her to use a walker but she "just can't do it" because she doesn't want to be that disabled.   TENS "helps and hurts at the same time".    - Medications: None new since last visit, increases medications periodically and "things work for [employer] but then they don't." Hasn't had any genetic testing done for medication efficacy.   Only using skelaxin at nighttime because it causes weakness worsening in her legs  Stopped mobic because of reflux  On Lyrica 100 mg BID at this point; legs still get restless at nighttime and has been getting charlie horses in her calf at nighttime.   Lidodern patches only help for a few hours  Oxycodone helps if used consistently; tries to spread out but she hurts if she does not use.    - Other concerns: Did recently quit her job after lots of discussions with Dr. Janeece Riggers, her therapists, her mom and her husband. She does Copy at home for recreation. She enjoys this and says the ladies at church like them.   She gets 5-7 hours of sleep per night, is compliant with her CPAP. Does not feel well rested, usually naps in the middle of the day. She spends a lot of time in her bedroom during the day. She has chairs in the house and a patio where she can sit, but sitting for any longer than an hour causes her legs to throb and she has to lay down.   Pain  Inventory Average Pain 8 Pain Right Now 7 My pain is 9  In the last 24 hours, has pain interfered with the following? General activity 9 Relation with others 9 Enjoyment of life 10 What TIME of day is your pain at its worst? daytime, evening, and night Sleep (in general) Poor  Pain is worse with: walking, bending, sitting, inactivity, standing, and some activites Pain improves with: rest, heat/ice, medication, and TENS Relief from Meds: 4  Family History  Problem Relation Age of Onset   Hypertension Father    Hypertension Maternal Grandmother    Hypertension Maternal Grandfather    Heart disease Maternal Grandfather    Diabetes Maternal Grandfather    Alcohol abuse Neg Hx    Arthritis Neg Hx    Asthma Neg Hx    Birth defects Neg Hx    Cancer  Neg Hx    Depression Neg Hx    COPD Neg Hx    Drug abuse Neg Hx    Early death Neg Hx    Hearing loss Neg Hx    Social History   Socioeconomic History   Marital status: Single    Spouse name: Not on file   Number of children: Not on file   Years of education: Not on file   Highest education level: Not on file  Occupational History   Not on file  Tobacco Use   Smoking status: Every Day    Packs/day: 1    Types: Cigarettes   Smokeless tobacco: Never   Tobacco comments:    1/2 pack a day  Vaping Use   Vaping Use: Former  Substance and Sexual Activity   Alcohol use: No   Drug use: Yes    Types: Oxycodone, Cocaine, Marijuana    Comment: denies 11/08/20   Sexual activity: Not on file  Other Topics Concern   Not on file  Social History Narrative   Not on file   Social Determinants of Health   Financial Resource Strain: Not on file  Food Insecurity: Not on file  Transportation Needs: Not on file  Physical Activity: Not on file  Stress: Not on file  Social Connections: Not on file   Past Surgical History:  Procedure Laterality Date   CESAREAN SECTION     LAPAROSCOPIC ABDOMINAL EXPLORATION     Past Surgical  History:  Procedure Laterality Date   CESAREAN SECTION     LAPAROSCOPIC ABDOMINAL EXPLORATION     Past Medical History:  Diagnosis Date   ADHD (attention deficit hyperactivity disorder)    ADHD (attention deficit hyperactivity disorder)    ADHD (attention deficit hyperactivity disorder)    Anxiety    Bipolar 1 disorder, depressed (HCC)    Chlamydia    Depression    Drug abuse (HCC)    Herpes    + blood test, never had outbreak   History of suicidal tendencies    Hypothyroidism    No meds.   Obesity    PCOS (polycystic ovarian syndrome)    Restless legs    Scoliosis    Substance abuse (HCC)    Trichomonas    BP (!) 167/108   Pulse 98   Ht 5\' 4"  (1.626 m)   Wt (!) 311 lb (141.1 kg)   SpO2 98%   BMI 53.38 kg/m   Opioid Risk Score:   Fall Risk Score:  `1  Depression screen [facility] 2/9     04/28/2022    1:54 PM 04/05/2022    9:31 AM 03/01/2022   10:43 AM  Depression screen PHQ 2/9  Decreased Interest 0 1 3  Down, Depressed, Hopeless 0 1 3  PHQ - 2 Score 0 2 6  Altered sleeping   2  Tired, decreased energy   3  Change in appetite   3  Feeling bad or failure about yourself    2  Trouble concentrating   1  Moving slowly or fidgety/restless   0  Suicidal thoughts   1  PHQ-9 Score   18      Review of Systems  Musculoskeletal:  Positive for back pain.       B/L knee leg foot pain RT arm shoulder pain  All other systems reviewed and are negative.      Objective:   Physical Exam   PE: Constitution: Tearful, but in no acute distress.  +  Obese Resp: No respiratory distress. No accessory muscle usage. on RA and CTAB Cardio: Well perfused appearance. Trace peripheral edema. Abdomen: Nondistended. Nontender.   Psych: Tearful and depressed, flat. Unable to redirect conversation away from debilitating pain and depression. No SI/HI.  Neuro: AAOx4. No apparent cognitive deficits   Neurologic Exam:   Reflexes:  Absent reflexes bilateral patella, achilles No babinski,  no clonus  Sensory exam:  + Patchy sensory deficits throughout RLE; decreased light touch LLE lateral proximal and mid-thigh Motor exam: 4/5 strength throughout BL LE in HF, abduction, adduciton, and knee extension Coordination: Fine motor coordination was normal.   Gait: +antalgic gait Symptoms worsening in L SI joint, L buttocks and L lateral thigh with facet loading  MSK: + Exquisite TTP thorughout lumbar paraspinals, SI joints, PSISs Negative slump, negative SLR +Facet loading bilaterally      Assessment & Plan:   Alyssa Hansen is a 32 y.o. year old female  who  has a past medical history of ADHD (attention deficit hyperactivity disorder), ADHD (attention deficit hyperactivity disorder), ADHD (attention deficit hyperactivity disorder), Anxiety, Bipolar 1 disorder, depressed (HCC), Chlamydia, Depression, Drug abuse (HCC), Herpes, History of suicidal tendencies, Hypothyroidism, Obesity, PCOS (polycystic ovarian syndrome), Restless legs, Scoliosis, Substance abuse (HCC), and Trichomonas.    They are presenting to PM&R clinic for chronic pain related to lumbosacral back pain .  Midline low back pain with sciatica, sciatica laterality unspecified, unspecified chronicity Chronic pain syndrome Spinal stenosis, lumbar region, with neurogenic claudication -     MR LUMBAR SPINE WO CONTRAST; Future  Indication for chronic opioid: Low Back pain Medication and dose: Oxycodone 10 mg Q4H PRN # pills per month: 180 Last UDS date: 04/05/22 Opioid Treatment Agreement signed (Y/N): Y, 03/04/22 Opioid Treatment Agreement last reviewed with patient:   NCCSRS/PDMP reviewed this encounter (include red flags): Yes; + Hx cocaine abuse, +Hx suboxone   Management will include: Medium Risk (10-90 MME) UDS every 3-6 months NCCSR check every visit Follow up Q1M initially, Q3M once established  Given sensory loss in BL LE, weakness, reduced reflexes and worsening episodes of spontaneous falls/giving out  worsened with all positions except laying flat, concerned for s/s neurogenic claudication; will get MRI as above  Follow up in 3 months for pain management. I discussed with patient that I do not think it would be appropriate to increase MMEQ given her diagnosis and my concern for escalating pain regimen; did discuss possibility of transition to methadone or suboxone for pain control. She has failed methadone in the past. She states she was told by her psychiatrist that suboxone for pain control with concurrent oxycodone is an option. We discussed possible referrals to other pain providers vs staying on with [place] while I look into this option further; patient agreeable to staying on with [place], will get back to her in the next 4 weeks about possible options.   Keep appointment with Dr. Wynn Banker for injections  Increase Lyrica to 150 mg twice daily  Fatigue  Stop skelaxin. Use baclofen as needed for muscle spasms. Continue magnesium supplements if they have bene helpful.  For sleep, I want you to pick a time to lay down every night, ideally between 8 and 10 PM.    Starting 1 hour before you want to go to sleep, turn off all television screens, phone screens, tablets, and computers.    Keep the lights low and perform only low stimulation activities, such as reading.    Only use your bedroom for  sleep and sex.    Other orders -     Baclofen; Take 1 tablet (10 mg total) by mouth 3 (three) times daily as needed for muscle spasms.  Dispense: 30 each; Refill: 0 -     Pregabalin; Take 1 capsule (150 mg total) by mouth 2 (two) times daily.  Dispense: 60 capsule; Refill: 3 -     oxyCODONE HCl; Take 1 tablet (10 mg total) by mouth every 4 (four) hours as needed.  Dispense: 180 tablet; Refill: 0 -     oxyCODONE HCl; Take 1 tablet (10 mg total) by mouth every 4 (four) hours as needed.  Dispense: 180 tablet; Refill: 0 -     oxyCODONE HCl; Take 1 tablet (10 mg total) by mouth every 4 (four) hours as needed.   Dispense: 180 tablet; Refill: 0

## 2022-07-20 DIAGNOSIS — R5382 Chronic fatigue, unspecified: Secondary | ICD-10-CM | POA: Insufficient documentation

## 2022-07-22 ENCOUNTER — Encounter: Payer: Self-pay | Admitting: Physical Medicine and Rehabilitation

## 2022-07-25 ENCOUNTER — Ambulatory Visit
Admission: RE | Admit: 2022-07-25 | Discharge: 2022-07-25 | Disposition: A | Discharge status: Home / Self Care | Payer: Medicaid Other | Source: Ambulatory Visit | Attending: Physical Medicine and Rehabilitation | Admitting: Physical Medicine and Rehabilitation

## 2022-07-25 DIAGNOSIS — M5440 Lumbago with sciatica, unspecified side: Secondary | ICD-10-CM

## 2022-07-25 DIAGNOSIS — M545 Low back pain, unspecified: Secondary | ICD-10-CM

## 2022-07-25 DIAGNOSIS — M48062 Spinal stenosis, lumbar region with neurogenic claudication: Secondary | ICD-10-CM

## 2022-07-27 NOTE — Progress Notes (Deleted)
HPI F Smoker followed for OSA, complicated by  Hypothyroid, PCOS, Hx Opioid use, Hx Cocaine use, ADHD, Anxiety, BiPolar, Hx Suicidal Tendency, Asthma, NPSG 02/24/20 (per Dr Stevphen Rochester Su)  AHI 18.2/ hr, desaturation to 89%, CPAP to 15, body weight 283 lbs  ================================================================================  03/19/20- 29 yoF current Smoker for sleep evaluation  Medical problem list includes Hypothyroid, PCOS, Hx Opioid use, Hx Cocaine use, ADHD, Anxiety, BiPolar, Hx Suicidal Tendency, Asthma, NPSG 02/24/20 (per Dr Stevphen Rochester Su)  AHI 18.2/ hr, desaturation to 89%, CPAP to 15, body weight 283 lbs Med list includes- Adderall XR 30, Xanax 1 mg, Buprenorphine-Naloxone BID, Lamictal,  Epworth score-22 Body weight today-290 lbs,  Covid vax-1 Moderna Flu vax-had Sleep study was ordered by Dr Janeece Riggers, her psychiatrist. She has long hx loud snoring. Remote sleep study elsewhere didn't show enough apnea for CPAP by her report. She has gained weight since then. Long hx of loud snoring. No ENT surgery. Wakes finding herself sitting edge of bed or propped on arm. No complex parasomnias. Daytime fatigue getting worse. Mild asthma- rarely needs rescue inhaler.  No hx heart disease or seizure.  Father on CPAP for OSA.  She has no insurance and will need some assistance. May be able to use a family member's old CPAP if that person can get replacement- supply chain delay.   10/16/20- 30 yoF Smoker followed for OSA, complicated by  Hypothyroid, PCOS, Hx Opioid use, Hx Cocaine use, ADHD, Anxiety, BiPolar, Hx Suicidal Tendency, Asthma, Obesity, Tobacco User,  -meds include synthroid, ventolin hfa, xanax 1 mg, naloxone, lamictal, adderall XR 30 CPAP auto 5-20/ [facility]   ordered 03/19/20 Download-her phone- compliance 96%, AHI 1.1/ hr Body weight today-296 lbs- Covid vax-1 Moderna Flu vax-declines -----Pt states no concerns Download reviewed. Doing well. Denies interval issues or  changes.   07/29/22- 32 yoF Smoker followed for OSA, complicated by  Hypothyroid, PCOS, Hx Opioid use, Hx Cocaine use, ADHD, Anxiety, BiPolar, Hx Suicidal Tendency, Asthma, Obesity, Tobacco User,  -meds include synthroid, ventolin hfa, xanax 1 mg, naloxone, lamictal, adderall XR 30 CPAP auto 5-20/ [facility]   ordered 03/19/20 Download-her phone- compliance Body weight today-    ROS-see HPI   + = positive Constitutional:    weight loss, night sweats, fevers, chills, +fatigue, lassitude. HEENT:    headaches, difficulty swallowing, tooth/dental problems, sore throat,       sneezing, itching, ear ache, nasal congestion, post nasal drip, snoring CV:    chest pain, orthopnea, PND, swelling in lower extremities, anasarca,                                   dizziness, palpitations Resp:   shortness of breath with exertion or at rest.                productive cough,   non-productive cough, coughing up of blood.              change in color of mucus.  wheezing.   Skin:    rash or lesions. GI:  No-   heartburn, indigestion, abdominal pain, nausea, vomiting, diarrhea,                 change in bowel habits, loss of appetite GU: dysuria, change in color of urine, no urgency or frequency.   flank pain. MS:   joint pain, stiffness, decreased range of motion, back pain. Neuro-  nothing unusual Psych:  change in mood or affect.  depression or anxiety.   memory loss.  OBJ- Physical Exam General- Alert, Oriented, Affect-appropriate, Distress- none acute, + obese Skin- +tatoo Lymphadenopathy- none Head- atraumatic        + stud upper lip            Eyes- Gross vision intact, PERRLA, conjunctivae and secretions clear            Ears- Hearing, canals-normal            Nose- Clear, no-Septal dev, mucus, polyps, erosion, perforation             Throat- Mallampati IV , mucosa clear , drainage- none, tonsils+, + few missing teeth Neck- flexible , trachea midline, no stridor , thyroid nl, carotid  no bruit Chest - symmetrical excursion , unlabored           Heart/CV- RRR , no murmur , no gallop  , no rub, nl s1 s2                           - JVD- none , edema- none, stasis changes- none, varices- none           Lung- clear to P&A, wheeze- none, cough- none , dullness-none, rub- none           Chest wall-  Abd-  Br/ Gen/ Rectal- Not done, not indicated Extrem- cyanosis- none, clubbing, none, atrophy- none, strength- nl Neuro- grossly intact to observation [id number]

## 2022-07-29 ENCOUNTER — Ambulatory Visit: Payer: Medicaid Other | Admitting: Internal Medicine

## 2022-08-05 ENCOUNTER — Encounter: Payer: Self-pay | Admitting: Physical Medicine & Rehabilitation

## 2022-08-05 ENCOUNTER — Encounter: Payer: Medicaid Other | Attending: Physical Medicine & Rehabilitation | Admitting: Physical Medicine & Rehabilitation

## 2022-08-05 DIAGNOSIS — M47816 Spondylosis without myelopathy or radiculopathy, lumbar region: Secondary | ICD-10-CM | POA: Diagnosis present

## 2022-08-05 DIAGNOSIS — M5440 Lumbago with sciatica, unspecified side: Secondary | ICD-10-CM | POA: Diagnosis present

## 2022-08-05 DIAGNOSIS — M48062 Spinal stenosis, lumbar region with neurogenic claudication: Secondary | ICD-10-CM | POA: Insufficient documentation

## 2022-08-05 DIAGNOSIS — R5382 Chronic fatigue, unspecified: Secondary | ICD-10-CM | POA: Insufficient documentation

## 2022-08-05 DIAGNOSIS — G894 Chronic pain syndrome: Secondary | ICD-10-CM | POA: Insufficient documentation

## 2022-08-05 MED ORDER — IOHEXOL 180 MG/ML  SOLN
3.0000 mL | Freq: Once | INTRAMUSCULAR | Status: AC
  Administered 2022-08-05: 3 mL

## 2022-08-05 MED ORDER — LIDOCAINE HCL 1 % IJ SOLN
10.0000 mL | Freq: Once | INTRAMUSCULAR | Status: AC
Start: 2022-08-05 — End: 2022-08-05
  Administered 2022-08-05: 10 mL

## 2022-08-05 MED ORDER — LIDOCAINE HCL (PF) 2 % IJ SOLN
4.0000 mL | Freq: Once | INTRAMUSCULAR | Status: AC
Start: 2022-08-05 — End: 2022-08-05
  Administered 2022-08-05: 4 mL

## 2022-08-05 NOTE — Progress Notes (Signed)
  PROCEDURE RECORD [facility]   Name: Alyssa Hansen DOB:[birth year] MRN: [id number]  Date:08/05/2022  Physician: Claudette Laws MD    Nurse/CMA: Jola Critzer S CMA  Allergies:  Allergies  Allergen Reactions   Amoxicillin Swelling, Hives, Itching, Other (See Comments) and Rash    Other Reaction: Not Assessed   Cefaclor Rash, Swelling, Hives and Itching    Consent Signed: Yes.    Is patient diabetic? No.  CBG today? N/a  Pregnant: No. LMP: No LMP recorded. Patient has had an implant. (age 46-55)  Anticoagulants: no Anti-inflammatory: no Antibiotics: no  Procedure: Bilateral Medial Branch Block L3-4-5  Position: Prone Start Time: 11:36    End Time: 11:48  Fluoro Time: 1.26  RN/CMA Nithin Demeo S Gazella Anglin S    Time 11:14 11:54    BP 156/91 157/107    Pulse 103 94    Respirations 18 18    O2 Sat 92 96    S/S 6 6    Pain Level 5 3     D/C home with spouse, patient A & O X 3, D/C instructions reviewed, and sits independently.

## 2022-08-05 NOTE — Progress Notes (Signed)
Bilateral Lumbar L3, L4  medial branch blocks and L 5 dorsal ramus injection under fluoroscopic guidance  Indication: Lumbar pain which is not relieved by medication management or other conservative care and interfering with self-care and mobility.  Informed consent was obtained after describing risks and benefits of the procedure with the patient, this includes bleeding, infection, paralysis and medication side effects.  The patient wishes to proceed and has given written consent.  The patient was placed in prone position.  The lumbar area was marked and prepped with Betadine.  One mL of 1% lidocaine was injected into each of 6 areas into the skin and subcutaneous tissue.  Then a 22-gauge 5" spinal needle was inserted targeting the junction of the left S1 superior articular process and sacral ala junction. Needle was advanced under fluoroscopic guidance.  Bone contact was made.Omnipaque 180 was injected x 0.5 mL demonstrating no intravascular uptake.  Then a solution  of 2% MPF lidocaine was injected x 0.5 mL.  Then the left L5 superior articular process in transverse process junction was targeted.  Bone contact was made. Omnipaque 180 was injected x 0.5 mL demonstrating no intravascular uptake. Then a solution containing  2% MPF lidocaine was injected x 0.5 mL.  Then the left L4 superior articular process in transverse process junction was targeted.  Bone contact was made. Omnipaque 180 was injected x 0.5 mL demonstrating no intravascular uptake.  Then a solution containing2% MPF lidocaine was injected x 0.5 mL.  This same procedure was performed on the right side using the same needle, technique and injectate.  Patient tolerated procedure well.  Post procedure instructions were given.   Lidocaine 1% with preservative multidose, 10ml no waste Lidocaine 2% MPF, 5ml bottle, 3ml used 2ml waste Omnipaque 180 1.5 ml used, 8.5 ml waste   Pre injection 5/10 Post injection 2/10  Pt has some residual pain  above injection sites, have reviewed MRI likely has symptoms from L3-4 levels in addition to L4-5 and L5-S1 Will add additional level proximally to next set of MBB

## 2022-08-05 NOTE — Patient Instructions (Signed)

## 2022-08-10 ENCOUNTER — Encounter: Payer: Self-pay | Admitting: Physical Medicine and Rehabilitation

## 2022-08-13 ENCOUNTER — Other Ambulatory Visit: Payer: Self-pay | Admitting: Physical Medicine and Rehabilitation

## 2022-08-13 ENCOUNTER — Encounter: Payer: Self-pay | Admitting: Physical Medicine and Rehabilitation

## 2022-08-18 NOTE — Addendum Note (Signed)
Addended by: Doreene Eland on: 08/18/2022 01:09 PM   Modules accepted: Orders

## 2022-08-20 ENCOUNTER — Encounter: Payer: Medicaid Other | Admitting: Physical Medicine and Rehabilitation

## 2022-08-20 DIAGNOSIS — M47816 Spondylosis without myelopathy or radiculopathy, lumbar region: Secondary | ICD-10-CM | POA: Diagnosis not present

## 2022-08-20 DIAGNOSIS — R5382 Chronic fatigue, unspecified: Secondary | ICD-10-CM

## 2022-08-20 DIAGNOSIS — G894 Chronic pain syndrome: Secondary | ICD-10-CM

## 2022-08-20 DIAGNOSIS — Z6841 Body Mass Index (BMI) 40.0 and over, adult: Secondary | ICD-10-CM

## 2022-08-20 DIAGNOSIS — M48062 Spinal stenosis, lumbar region with neurogenic claudication: Secondary | ICD-10-CM | POA: Diagnosis not present

## 2022-08-20 DIAGNOSIS — M5440 Lumbago with sciatica, unspecified side: Secondary | ICD-10-CM | POA: Diagnosis not present

## 2022-08-20 NOTE — Progress Notes (Signed)
Expand All Collapse All    Subjective:   Virtual Visit via Audio Note  I connected with Alyssa Hansen on 08/20/22 at 10:00 AM EDT by a video enabled telemedicine application and verified that I am speaking with the correct person using two identifiers.  Unfortunately, due to connection difficulties, the majority of this patient's visit was conducted over the phone and not via telemedicine app.  Location: Patient: Home Provider: In office   I discussed the limitations of evaluation and management by telemedicine and the availability of in person appointments. The patient expressed understanding and agreed to proceed.  I provided 50 minutes of non-face-to-face time during this encounter.  Patient ID: Alyssa Hansen, female    DOB: 12-18-1990, 32 y.o.   MRN: [id number]   HPI   Alyssa Hansen is a 32 y.o. year old female  who  has a past medical Alyssa of Alyssa (attention deficit hyperactivity Hansen), Alyssa (attention deficit hyperactivity Hansen), Alyssa (attention deficit hyperactivity Hansen), Alyssa Hansen, Alyssa Hansen, Alyssa (HCC), Alyssa Hansen, Alyssa Hansen, Alyssa abuse (HCC), Alyssa Hansen, Alyssa Hansen, Hypothyroidism, Obesity, PCOS (polycystic ovarian syndrome), Restless legs, Scoliosis, Substance abuse (HCC), and Trichomonas.    They are presenting today via telemedicine to [facility] in our clinic to discuss chronic pain, fibromyalgia and low back pain with left-sided sciatica.   Plan from last visit: Follow up in 3 months for pain management   Keep appointment with Dr. Wynn Banker for injections   Increase Lyrica to 150 mg twice daily   Stop skelaxin. Use baclofen as needed for muscle spasms. Continue magnesium supplements if they have bene helpful.   I will look into concurrent prescribing of suboxone with oxycodone for pain control; I will try To get back to you within the next month about options.    I will see you in 3 months for new scripts and management   For  sleep, I want you to pick a time to lay down every night, ideally between 8 and 10 PM.     Starting 1 hour before you want to go to sleep, turn off all television screens, phone screens, tablets, and computers.     Keep the lights low and perform only low stimulation activities, such as reading.     Only use your bedroom for sleep and sex.        Interval Hx:  - Patient doing better overall than when she was messaging me last week; states she was out of her mood stabilizer for a week and started a new job and was very stressed out. Also had a toothache going on that has been giving her problems; has appointment to get extracted.    - Follow ups: Back pain post-injection with Dr. Wynn Banker has gotten better. She states she has gotten stabbing and burning pains in her back going down her left leg immediately after getting home, approximately 2 hours after the procedure.  She was not aware that the nerve block would wear off after a few hours.   She is continue to get severe back pain and spasms, traveling into her left leg when getting up and going to the laundry room.  She has had to maintain considerable rest since her last appointment to prevent flaring of her symptoms   - Falls: None recently   - Medications: "Since I started taking ibuprofen I haven't been been taking Tylenol." She is taking Ibuprofen 3-4x daily for her tooth ache; usually takes twice daily if not.  Taking baclofen with Lyrica seems to help considerably with back pain.   Taking her oxycodone every 4 hours as scheduled, she states that she still gets benefit from this and does not feel like she is getting desensitized.  Denies any side effects.   - Other concerns: Patient states considerable concern with loss of function over the last 2 years and inability to get her pain under control.  Asked multiple times "but one of my going to do about my pain?",  After discussing plan of care, current, and possible future  interventions.  Regarding sleep, patient states that daytime fatigue and frequent napping is a major contributor to loss in her quality of life.  She endorses compliance with her CPAP machine and adequate sleep at nighttime, 5 to 7 hours nightly.  Regarding gaining weight/current weight, patient states that she was being seen in a weight loss clinic for some time and failed both Ozempic and Phentermine.  She does not keep a food diary but states when she did, she did not find that she ate very much, and continues to only eat 1-2 very small meals per day.  She did discuss bariatric surgery after failing medication, but decided not to pursue this due to potential complications.  She states she was told in the past that the damage to her back is permanent, and losing weight would not affect it.    Pain Inventory Average Pain 8 Pain Right Now 7 My pain is 9   In the last 24 hours, has pain interfered with the following? General activity 9 Relation with others 9 Enjoyment of life 10 What TIME of day is your pain at its worst? daytime, evening, and night Sleep (in general) Poor   Pain is worse with: walking, bending, sitting, inactivity, standing, and some activites Pain improves with: rest, heat/ice, medication, and TENS Relief from Meds: 4        Family Alyssa  Problem Relation Age of Onset   Hypertension Father     Hypertension Maternal Grandmother     Hypertension Maternal Grandfather     Heart disease Maternal Grandfather     Diabetes Maternal Grandfather     Alcohol abuse Neg Hx     Arthritis Neg Hx     Asthma Neg Hx     Birth defects Neg Hx     Cancer Neg Hx     Alyssa Hansen Neg Hx     COPD Neg Hx     Alyssa abuse Neg Hx     Early death Neg Hx     Hearing loss Neg Hx          Social Alyssa         Socioeconomic Alyssa   Marital status: Single      Spouse name: Not on file   Number of children: Not on file   Years of education: Not on file   Highest education  level: Not on file  Occupational Alyssa   Not on file  Tobacco Use   Smoking status: Every Day      Packs/day: 1      Types: Cigarettes   Smokeless tobacco: Never   Tobacco comments:      1/2 pack a day  Vaping Use   Vaping Use: Former  Substance and Sexual Activity   Alcohol use: No   Alyssa use: Yes      Types: Oxycodone, Cocaine, Marijuana      Comment: denies 11/08/20   Sexual activity: Not  on file  Other Topics Concern   Not on file  Social Alyssa Narrative   Not on file    Social Determinants of Health    Financial Resource Strain: Not on file  Food Insecurity: Not on file  Transportation Needs: Not on file  Physical Activity: Not on file  Stress: Not on file  Social Connections: Not on file         Past Surgical Alyssa:  Procedure Laterality Date   CESAREAN SECTION       LAPAROSCOPIC ABDOMINAL EXPLORATION                 Past Surgical Alyssa:  Procedure Laterality Date   CESAREAN SECTION       LAPAROSCOPIC ABDOMINAL EXPLORATION                Past Medical Alyssa:  Diagnosis Date   Alyssa (attention deficit hyperactivity Hansen)     Alyssa (attention deficit hyperactivity Hansen)     Alyssa (attention deficit hyperactivity Hansen)     Alyssa Hansen     Alyssa Hansen, Alyssa (HCC)     Alyssa Hansen     Alyssa Hansen     Alyssa abuse (HCC)     Alyssa Hansen      + blood test, never had outbreak   Alyssa Hansen     Hypothyroidism      No meds.   Obesity     PCOS (polycystic ovarian syndrome)     Restless legs     Scoliosis     Substance abuse (HCC)     Trichomonas          BP (!) 167/108   Pulse 98   Ht 5\' 4"  (1.626 m)   Wt (!) 311 lb (141.1 kg)   SpO2 98%   BMI 53.38 kg/m    Opioid Risk Score:   Fall Risk Score:  `1   Alyssa Hansen screen [facility] 2/9       04/28/2022    1:54 PM 04/05/2022    9:31 AM 03/01/2022   10:43 AM  Alyssa Hansen screen PHQ 2/9  Decreased Interest 0 1 3  Down, Alyssa, Hopeless 0 1 3  PHQ - 2 Score 0 2 6   Altered sleeping     2  Tired, decreased energy     3  Change in appetite     3  Feeling bad or failure about yourself      2  Trouble concentrating     1  Moving slowly or fidgety/restless     0  Suicidal thoughts     1  PHQ-9 Score     18        Review of Systems  Musculoskeletal:  Positive for back pain.       B/L knee leg foot pain RT arm shoulder pain  All other systems reviewed and are negative.         Objective:    Objective Physical Exam Unable to perform due to limitations of telephone visit.    Patient in no acute distress.  Is somewhat tearful, Alyssa mood over the phone.  Fully oriented, no obvious cognitive deficits.          Assessment & Plan:    NURIAH BELLINGER is a 32 y.o. year old female  who  has a past medical Alyssa of Alyssa (attention deficit hyperactivity Hansen), Alyssa (attention deficit hyperactivity Hansen), Alyssa (attention deficit hyperactivity Hansen), Alyssa Hansen, Alyssa Hansen, Alyssa (HCC), Alyssa Hansen,  Alyssa Hansen, Alyssa abuse (HCC), Alyssa Hansen, Alyssa Hansen, Hypothyroidism, Obesity, PCOS (polycystic ovarian syndrome), Restless legs, Scoliosis, Substance abuse (HCC), and Trichomonas.    They are presenting to PM&R clinic via telehealth/telephone visit for chronic pain related to lumbosacral back pain .   Midline low back pain with sciatica, sciatica laterality unspecified, unspecified chronicity Lumbar facet joint syndrome Spinal stenosis, lumbar region, with neurogenic claudication  Patient did have good temporary low back pain relief following L3, 4, 5 medial branch blocks with Dr. Wynn Banker on 8-1 24.  However, lasted less than 2 hours, and resulted in excruciating back pain for the following week.  Also, did not affect neuropathic symptoms in bilateral legs.  Discussed return to Dr. Wynn Banker for alternative injections versus neurosurgical referral for second opinion.  Patient opting for neurosurgical referral, placed  today.  Chronic fatigue Obesity, morbid (HCC) Patient complains of persistent daytime fatigue despite compliance use of CPAP and adequate sleep 5 to 7 hours nightly.  Has failed outpatient weight loss management clinic with Ozempic and phentermine, advised patient to resume food diary to ensure accurate assessment of calorie intake.  Also discussed possible referral to bariatric surgery, however she does not wish to pursue this due to concerns about surgical risks.  I did discuss with her that, while weight loss will not reverse the arthritic changes in her back, it will take significant wear off of her joints and I do think could improve her pain control and fatigue.    Chronic pain syndrome Recommended alternating Tylenol 1000 mg up to 3 times daily with ibuprofen 800 mg 3 times daily, which she is currently using for toothache temporarily.  Discussed increasing Lyrica to 225 mg twice daily, after discussion of risks and benefits patient wishes to defer at this time continue current 150 mg twice daily dosing.  Continue baclofen, which patient is benefiting from twice daily  Had extensive discussion with the patient regarding indications for opiate medication escalation, and providers opinion that this would not benefit her long-term.  We have discussed a few alternatives in the past such as Butrans patch and Subutex, which is ultimately not been able to transition to.  I discussed with her my pain management philosophy and offered to refer her to an alternative pain management clinic if she does not agree with this plan of care, patient wishes to continue with our office at this time understanding that opiates will not be dose escalated unless for specific temporary reasons such as postop or postintervention.  Had a long discussion with patient regarding inappropriate behaviors regarding frequent use of MyChart messaging; established boundaries for messaging only related to specific medication  questions and to limit to once monthly unless provider give specific exception.  Patient is agreeable to this.

## 2022-09-11 NOTE — Progress Notes (Unsigned)
HPI F Smoker followed for OSA, complicated by  Hypothyroid, PCOS, Hx Opioid use, Hx Cocaine use, ADHD, Anxiety, BiPolar, Hx Suicidal Tendency, Asthma, NPSG 02/24/20 (per Dr Stevphen Rochester Su)  AHI 18.2/ hr, desaturation to 89%, CPAP to 15, body weight 283 lbs  ================================================================================   She has no insurance and will need some assistance. May be able to use a family member's old CPAP if that person can get replacement- supply chain delay.   10/16/20- 30 yoF Smoker followed for OSA, complicated by  Hypothyroid, PCOS, Hx Opioid use, Hx Cocaine use, ADHD, Anxiety, BiPolar, Hx Suicidal Tendency, Asthma, Obesity, Tobacco User,  -meds include synthroid, ventolin hfa, xanax 1 mg, naloxone, lamictal, adderall XR 30 CPAP auto 5-20/ [facility]   ordered 03/19/20 Download-her phone- compliance 96%, AHI 1.1/ hr Body weight today-296 lbs- Covid vax-1 Moderna Flu vax-declines -----Pt states no concerns Download reviewed. Doing well. Denies interval issues or changes.   09/14/22- 32 yoF Smoker followed for OSA, complicated by  Hypothyroid, PCOS, Hx Opioid use, Hx Cocaine use, ADHD, Anxiety, BiPolar, Hx Suicidal Tendency, Asthma, Obesity, Tobacco User,  -meds include synthroid, ventolin hfa, xanax 1 mg, naloxone, lamictal, adderall XR 30 CPAP auto 5-20/ [facility]   ordered 03/19/20 Download-her phone- compliance- Body weight today-    ROS-see HPI   + = positive Constitutional:    weight loss, night sweats, fevers, chills, +fatigue, lassitude. HEENT:    headaches, difficulty swallowing, tooth/dental problems, sore throat,       sneezing, itching, ear ache, nasal congestion, post nasal drip, snoring CV:    chest pain, orthopnea, PND, swelling in lower extremities, anasarca,                                   dizziness, palpitations Resp:   shortness of breath with exertion or at rest.                productive cough,   non-productive cough,  coughing up of blood.              change in color of mucus.  wheezing.   Skin:    rash or lesions. GI:  No-   heartburn, indigestion, abdominal pain, nausea, vomiting, diarrhea,                 change in bowel habits, loss of appetite GU: dysuria, change in color of urine, no urgency or frequency.   flank pain. MS:   joint pain, stiffness, decreased range of motion, back pain. Neuro-     nothing unusual Psych:  change in mood or affect.  depression or anxiety.   memory loss.  OBJ- Physical Exam General- Alert, Oriented, Affect-appropriate, Distress- none acute, + obese Skin- +tatoo Lymphadenopathy- none Head- atraumatic        + stud upper lip            Eyes- Gross vision intact, PERRLA, conjunctivae and secretions clear            Ears- Hearing, canals-normal            Nose- Clear, no-Septal dev, mucus, polyps, erosion, perforation             Throat- Mallampati IV , mucosa clear , drainage- none, tonsils+, + few missing teeth Neck- flexible , trachea midline, no stridor , thyroid nl, carotid no bruit Chest - symmetrical excursion , unlabored  Heart/CV- RRR , no murmur , no gallop  , no rub, nl s1 s2                           - JVD- none , edema- none, stasis changes- none, varices- none           Lung- clear to P&A, wheeze- none, cough- none , dullness-none, rub- none           Chest wall-  Abd-  Br/ Gen/ Rectal- Not done, not indicated Extrem- cyanosis- none, clubbing, none, atrophy- none, strength- nl Neuro- grossly intact to observation

## 2022-09-14 ENCOUNTER — Encounter: Payer: Self-pay | Admitting: Internal Medicine

## 2022-09-14 ENCOUNTER — Ambulatory Visit: Payer: Medicaid Other | Admitting: Internal Medicine

## 2022-09-14 ENCOUNTER — Encounter: Payer: Medicaid Other | Admitting: Physical Medicine & Rehabilitation

## 2022-09-14 VITALS — BP 130/88 | HR 101 | Ht 64.0 in | Wt 314.8 lb

## 2022-09-14 DIAGNOSIS — Z720 Tobacco use: Secondary | ICD-10-CM

## 2022-09-14 DIAGNOSIS — G4733 Obstructive sleep apnea (adult) (pediatric): Secondary | ICD-10-CM

## 2022-09-14 NOTE — Assessment & Plan Note (Signed)
Encouraged to walk more for exercise, stimulation and weight loss.

## 2022-09-14 NOTE — Assessment & Plan Note (Addendum)
Maintain encouragement to taper down and off of cigarettes.

## 2022-09-14 NOTE — Assessment & Plan Note (Signed)
Benefits from CPAP with good compliance and control Plan- continue auto 5-20 

## 2022-09-14 NOTE — Patient Instructions (Addendum)
We can continue CPAP auto 5-20  Keep up the good work- you are doing great!!  Try to get some exercise- walking- so you don't sit all day.

## 2022-09-24 ENCOUNTER — Encounter: Payer: Medicaid Other | Attending: Physical Medicine & Rehabilitation | Admitting: Physical Medicine & Rehabilitation

## 2022-09-24 ENCOUNTER — Encounter: Payer: Self-pay | Admitting: Physical Medicine & Rehabilitation

## 2022-09-24 VITALS — BP 125/74 | HR 113 | Ht 64.0 in | Wt 314.8 lb

## 2022-09-24 DIAGNOSIS — M533 Sacrococcygeal disorders, not elsewhere classified: Secondary | ICD-10-CM | POA: Diagnosis not present

## 2022-09-24 DIAGNOSIS — M47816 Spondylosis without myelopathy or radiculopathy, lumbar region: Secondary | ICD-10-CM | POA: Diagnosis present

## 2022-09-24 DIAGNOSIS — M5440 Lumbago with sciatica, unspecified side: Secondary | ICD-10-CM | POA: Diagnosis present

## 2022-09-24 NOTE — Patient Instructions (Signed)
Sacroiliac Joint Injection A sacroiliac (SI) joint injection is a procedure to inject a numbing medicine (anesthetic block)--and sometimes a strong anti-inflammatory medicine (steroid)--into the SI joint. The SI joint is the joint between two bones of the pelvis called the sacrum and the ilium. The sacrum is the bone at the base of the spine. The ilium is the large bone that forms the hip. You may need this procedure if you have pain because of an inflamed or diseased SI joint. Various conditions can cause pain in the SI joint, including rheumatoid arthritis, gout, psoriatic arthritis, infection, or injury. SI joint pain is a common cause of lower back pain. It may also cause pain in your buttocks or leg. SI joint injection may be done to: Find out if an anesthetic block relieves pain. This can confirm that the SI joint is the cause of pain (diagnostic use). Treat a painful SI joint with steroids, anesthetic medicine, or both (therapeutic use). Tell a health care provider about: Any allergies you have. All medicines you are taking, including vitamins, herbs, eye drops, creams, and over-the-counter medicines. Any problems you or family members have had with anesthetic medicines. Any blood disorders you have. Any surgeries you have had. Any medical conditions you have. Whether you are pregnant or may be pregnant. What are the risks? Generally, this is a safe procedure. However, problems may occur, including: Infection. Bleeding. Nerve injury. Temporary increase in pain or failure to relieve pain. Headache. Bruising or soreness at the joint, in deep tissues, or at the injection site. Allergic reactions to medicines or dyes. There may also be side effects from the steroid medicine. These may include facial flushing, increased appetite, diarrhea, and increased blood sugar. What happens before the procedure? Medicines Ask your health care provider about: Changing or stopping your regular  medicines. This is especially important if you are taking diabetes medicines or blood thinners. Taking medicines such as aspirin and ibuprofen. These medicines can thin your blood. Do not take these medicines unless your health care provider tells you to take them. Taking over-the-counter medicines, vitamins, herbs, and supplements. General instructions You may have a physical exam. You may have imaging tests, such as an X-ray, CT scan, or MRI. Follow instructions from your health care provider about eating or drinking restrictions. Ask your health care provider what steps will be taken to help prevent infection. This may include washing skin with a germ-killing soap. Plan to have a responsible adult take you home from the hospital or clinic. What happens during the procedure?  You will be awake during the procedure and may be given one or more of the following: A medicine to help you relax (sedative). A medicine to numb the area (local anesthetic). Your health care provider will inject a local anesthetic into the skin above your SI joint. You will be placed in the proper position on a procedure table to give the health care team the best access to your SI joint. An X-ray machine that produces moving X-ray images (fluoroscopy) will be placed above the procedure table. A long, thin needle will be inserted through your skin and down to your SI joint. The position of the needle will be checked with fluoroscopy imaging. An X-ray dye will be injected to make sure the needle enters the joint space. You may be asked if you feel any pain. Long-acting anesthetic medicine will be injected. Long-acting steroid medicine may also be injected. The needle will be removed, and a bandage will be placed  over the injection site. The procedure may vary among health care providers and hospitals. What happens after the procedure? Your blood pressure, heart rate, breathing rate, and blood oxygen level will be  monitored until you leave the hospital or clinic. Since dye was used, you will be told to drink plenty of water to wash (flush) the dye out of your body. You may be asked if you have pain relief from the injection. You will likely be able to go home shortly after the procedure. Your health care provider will give you instructions for taking care of yourself after the procedure. These may include instructions for doing physical therapy exercises. If you were given a sedative during the procedure, it can affect you for several hours. Do not drive or operate machinery until your health care provider says that it is safe. Summary A sacroiliac (SI) joint injection is a procedure to inject a numbing medicine (anesthetic block)--and sometimes a strong anti-inflammatory medicine (steroid)--into the SI joint. You will be awake during the procedure. You may be given a sedative, a local anesthetic, or both. If you were given a sedative during the procedure, it can affect you for several hours. Do not drive or operate machinery until your health care provider says that it is safe. This information is not intended to replace advice given to you by your health care provider. Make sure you discuss any questions you have with your health care provider. Document Revised: 05/03/2019 Document Reviewed: 05/03/2019 Elsevier Patient Education  2024 [employer].

## 2022-09-24 NOTE — Progress Notes (Signed)
Subjective:    Patient ID: Alyssa Hansen, female    DOB: 03/25/1990, 32 y.o.   MRN: [id number]  HPI 32 year old female with chronic axial low back pain. She underwent bilateral L3-L4 medial branch blocks and L5 dorsal ramus injection under fluoroscopic guidance on 08/05/2022.  She had a 60% pain relief dropping her preinjection pain score from 5-2 postinjection. She states that the majority of her pain is below the waistline. She has no pain radiating to her lower extremities or numbness or weakness in her lower extremities.  No bowel or bladder dysfunction Pain Inventory Average Pain 7 Pain Right Now 6 My pain is constant, sharp, burning, stabbing, tingling, and aching  In the last 24 hours, has pain interfered with the following? General activity 8 Relation with others 8 Enjoyment of life 9 What TIME of day is your pain at its worst? morning , daytime, and evening Sleep (in general) Fair  Pain is worse with: walking, bending, sitting, inactivity, standing, and some activites Pain improves with: rest, heat/ice, medication, and TENS Relief from Meds: 4  Family History  Problem Relation Age of Onset   Hypertension Father    Hypertension Maternal Grandmother    Hypertension Maternal Grandfather    Heart disease Maternal Grandfather    Diabetes Maternal Grandfather    Alcohol abuse Neg Hx    Arthritis Neg Hx    Asthma Neg Hx    Birth defects Neg Hx    Cancer Neg Hx    Depression Neg Hx    COPD Neg Hx    Drug abuse Neg Hx    Early death Neg Hx    Hearing loss Neg Hx    Social History   Socioeconomic History   Marital status: Single    Spouse name: Not on file   Number of children: Not on file   Years of education: Not on file   Highest education level: Not on file  Occupational History   Not on file  Tobacco Use   Smoking status: Every Day    Current packs/day: 1.00    Types: Cigarettes    Passive exposure: Current   Smokeless tobacco: Never   Tobacco comments:     Smoke 1 pack per day//PAP 09/14/22  Vaping Use   Vaping status: Former  Substance and Sexual Activity   Alcohol use: No   Drug use: Yes    Types: Oxycodone, Cocaine, Marijuana    Comment: denies 11/08/20   Sexual activity: Not on file  Other Topics Concern   Not on file  Social History Narrative   Not on file   Social Determinants of Health   Financial Resource Strain: Not on file  Food Insecurity: Not on file  Transportation Needs: Not on file  Physical Activity: Not on file  Stress: Not on file  Social Connections: Unknown (11/09/2021)   Received from [employer], [facility]   Social Network    Social Network: Not on file   Past Surgical History:  Procedure Laterality Date   CESAREAN SECTION     LAPAROSCOPIC ABDOMINAL EXPLORATION     Past Surgical History:  Procedure Laterality Date   CESAREAN SECTION     LAPAROSCOPIC ABDOMINAL EXPLORATION     Past Medical History:  Diagnosis Date   ADHD (attention deficit hyperactivity disorder)    ADHD (attention deficit hyperactivity disorder)    ADHD (attention deficit hyperactivity disorder)    Anxiety    Bipolar 1 disorder, depressed (HCC)  Chlamydia    Depression    Drug abuse (HCC)    Herpes    + blood test, never had outbreak   History of suicidal tendencies    Hypothyroidism    No meds.   Obesity    PCOS (polycystic ovarian syndrome)    Restless legs    Scoliosis    Substance abuse (HCC)    Trichomonas    BP 125/74   Pulse (!) 113   Ht 5\' 4"  (1.626 m)   Wt (!) 314 lb 12.8 oz (142.8 kg)   SpO2 98%   BMI 54.04 kg/m   Opioid Risk Score:   Fall Risk Score:  `1  Depression screen [facility] 2/9     09/24/2022    2:31 PM 07/14/2022   12:00 PM 04/28/2022    1:54 PM 04/05/2022    9:31 AM 03/01/2022   10:43 AM  Depression screen PHQ 2/9  Decreased Interest 3 2 0 1 3  Down, Depressed, Hopeless 3 2 0 1 3  PHQ - 2 Score 6 4 0 2 6  Altered sleeping     2  Tired, decreased energy     3  Change in appetite      3  Feeling bad or failure about yourself      2  Trouble concentrating     1  Moving slowly or fidgety/restless     0  Suicidal thoughts     1  PHQ-9 Score     18     Review of Systems  Constitutional: Negative.   HENT: Negative.    Eyes: Negative.   Respiratory: Negative.    Cardiovascular: Negative.   Gastrointestinal: Negative.   Endocrine: Negative.   Genitourinary: Negative.   Musculoskeletal:  Positive for back pain and neck pain.  Skin: Negative.   Allergic/Immunologic: Negative.   Neurological: Negative.   Hematological: Negative.   Psychiatric/Behavioral:  Positive for dysphoric mood.   All other systems reviewed and are negative.      Objective:   Physical Exam General No acute distress Mood and affect are appropriate Motor strength is 5/5 bilateral hip flexor knee extensor ankle dorsiflexor and plantar flexor  Sacroiliac joint tests Gaenslens: Positive left Sacral thrust (prone) : Positive bilateral Lateral compression: Negative FABER's: Positive left greater than right Distraction (supine): Negative Thigh thrust test: Left greater than right  Lumbar range of motion 50% flexion extension lateral bending and rotation.  Has greater pain with extension than with flexion. Negative straight leg raise bilaterally  Sensation normal bilateral L2-L3-L4 L5-S1 dermatomal distribution Tone is normal in the lower extremities    Assessment & Plan:  1.  Chronic axial low back pain.  She received 60% relief following lumbar medial branch blocks not meeting the 80% criteria.  I do think that some of her residual pain was actually at the L3-4 level and additional level would be needed to further assess this as a pain generator  The majority of pain complaints are below the waist and she does have 4/6 sacroiliac on the right side and 3 on the left side. Would recommend left sacroiliac injection to further assess this is a pain generator this would be a diagnostic  block.

## 2022-09-30 ENCOUNTER — Encounter: Payer: Self-pay | Admitting: Nutrition

## 2022-10-05 ENCOUNTER — Encounter: Payer: Medicaid Other | Attending: Physical Medicine & Rehabilitation | Admitting: Physical Medicine & Rehabilitation

## 2022-10-05 ENCOUNTER — Encounter: Payer: Self-pay | Admitting: Physical Medicine & Rehabilitation

## 2022-10-05 VITALS — BP 142/85 | HR 93 | Ht 64.0 in | Wt 316.0 lb

## 2022-10-05 DIAGNOSIS — G894 Chronic pain syndrome: Secondary | ICD-10-CM | POA: Insufficient documentation

## 2022-10-05 DIAGNOSIS — R5382 Chronic fatigue, unspecified: Secondary | ICD-10-CM | POA: Diagnosis present

## 2022-10-05 DIAGNOSIS — Z79891 Long term (current) use of opiate analgesic: Secondary | ICD-10-CM | POA: Insufficient documentation

## 2022-10-05 DIAGNOSIS — M533 Sacrococcygeal disorders, not elsewhere classified: Secondary | ICD-10-CM | POA: Diagnosis not present

## 2022-10-05 DIAGNOSIS — Z5181 Encounter for therapeutic drug level monitoring: Secondary | ICD-10-CM | POA: Diagnosis present

## 2022-10-05 DIAGNOSIS — M5442 Lumbago with sciatica, left side: Secondary | ICD-10-CM | POA: Diagnosis present

## 2022-10-05 DIAGNOSIS — G8929 Other chronic pain: Secondary | ICD-10-CM | POA: Insufficient documentation

## 2022-10-05 MED ORDER — LIDOCAINE HCL 1 % IJ SOLN
5.0000 mL | Freq: Once | INTRAMUSCULAR | Status: AC
Start: 2022-10-05 — End: 2022-10-05
  Administered 2022-10-05: 5 mL

## 2022-10-05 MED ORDER — LIDOCAINE HCL (PF) 2 % IJ SOLN
2.0000 mL | Freq: Once | INTRAMUSCULAR | Status: AC
Start: 2022-10-05 — End: 2022-10-05
  Administered 2022-10-05: 2 mL

## 2022-10-05 MED ORDER — IOHEXOL 180 MG/ML  SOLN
1.0000 mL | Freq: Once | INTRAMUSCULAR | Status: AC
  Administered 2022-10-05: 1 mL

## 2022-10-05 MED ORDER — BETAMETHASONE SOD PHOS & ACET 6 (3-3) MG/ML IJ SUSP
3.0000 mg | Freq: Once | INTRAMUSCULAR | Status: AC
Start: 2022-10-05 — End: 2022-10-05
  Administered 2022-10-05: 3 mg via INTRAMUSCULAR

## 2022-10-05 NOTE — Progress Notes (Signed)
Left sacroiliac injection under fluoroscopic guidance  Indication: Left Low back and buttocks pain not relieved by medication management and other conservative care.  Informed consent was obtained after describing risks and benefits of the procedure with the patient, this includes bleeding, bruising, infection, paralysis and medication side effects. The patient wishes to proceed and has given written consent. The patient was placed in a prone position. The lumbar and sacral area was marked and prepped with Betadine. A 25-gauge 1-1/2 inch needle was inserted into the skin and subcutaneous tissue and 1 mL of 1% lidocaine was injected. Then a 22-gauge 5 inch spinal needle was inserted under fluoroscopic guidance into the left sacroiliac joint. AP and lateral images were utilized. Isovue 200x0.5 mL under live fluoroscopy demonstrated no intravascular uptake. Then a solution containing one ML of 6 mg per mLbetamethasone and 2 ML of 2% lidocaine MPF was injected x1.5 mL. Patient tolerated the procedure well. Post procedure instructions were given. Please see post procedure form.   May be able to use 25g 3.5 " needle for subsequent injection Pre inj pain 5/10 Post inj pain 2/10

## 2022-10-05 NOTE — Patient Instructions (Signed)
Left sacroiliac injection under fluoroscopic guidance  Indication: Left Low back and buttocks pain not relieved by medication management and other conservative care.  Informed consent was obtained after describing risks and benefits of the procedure with the patient, this includes bleeding, bruising, infection, paralysis and medication side effects. The patient wishes to proceed and has given written consent. The patient was placed in a prone position. The lumbar and sacral area was marked and prepped with Betadine. A 25-gauge 1-1/2 inch needle was inserted into the skin and subcutaneous tissue and 1 mL of 1% lidocaine was injected. Then a 25-gauge 3 inch spinal needle was inserted under fluoroscopic guidance into the left sacroiliac joint. AP and lateral images were utilized. Isovue 200x0.5 mL under live fluoroscopy demonstrated no intravascular uptake. Then a solution containing one ML of 6 mg per mLbetamethasone and 2 ML of 2% lidocaine MPF was injected x1.5 mL. Patient tolerated the procedure well. Post procedure instructions were given. Please see post procedure form. 

## 2022-10-13 ENCOUNTER — Telehealth: Payer: Self-pay

## 2022-10-13 ENCOUNTER — Encounter: Payer: Self-pay | Admitting: Physical Medicine and Rehabilitation

## 2022-10-13 ENCOUNTER — Encounter: Payer: Medicaid Other | Admitting: Physical Medicine and Rehabilitation

## 2022-10-13 VITALS — BP 150/81 | HR 86 | Ht 64.0 in | Wt 306.0 lb

## 2022-10-13 DIAGNOSIS — Z79891 Long term (current) use of opiate analgesic: Secondary | ICD-10-CM | POA: Diagnosis not present

## 2022-10-13 DIAGNOSIS — M5442 Lumbago with sciatica, left side: Secondary | ICD-10-CM

## 2022-10-13 DIAGNOSIS — R5382 Chronic fatigue, unspecified: Secondary | ICD-10-CM

## 2022-10-13 DIAGNOSIS — Z5181 Encounter for therapeutic drug level monitoring: Secondary | ICD-10-CM | POA: Diagnosis not present

## 2022-10-13 DIAGNOSIS — M533 Sacrococcygeal disorders, not elsewhere classified: Secondary | ICD-10-CM

## 2022-10-13 DIAGNOSIS — G894 Chronic pain syndrome: Secondary | ICD-10-CM

## 2022-10-13 DIAGNOSIS — G8929 Other chronic pain: Secondary | ICD-10-CM

## 2022-10-13 MED ORDER — OXYCODONE HCL 10 MG PO TABS: 10.0000 mg | ORAL_TABLET | ORAL | 0 refills | Status: DC | PRN

## 2022-10-13 MED ORDER — PREGABALIN 150 MG PO CAPS: 150.0000 mg | ORAL_CAPSULE | Freq: Two times a day (BID) | ORAL | 3 refills | Status: AC

## 2022-10-13 NOTE — Patient Instructions (Addendum)
Get your labs form [facility] and send them to me; if your renal function looks OK, we will start celebrex 100 mg daily to replace your ibuprofen.   Talk to your PCP about Tylenol use with your current liver issues.  Continue current doses of oxycodone, lyrica, and baclofen. You will follow up with our NP Riley Lam every month and myself every 3 months from now on.   Try to sleep in 40-90 minute intervals to optimize REM cycles. Work on sleeping in a comfortable, dark environment at the same time every day and minimize interruptions as able.

## 2022-10-13 NOTE — Progress Notes (Signed)
Subjective:    Patient ID: Alyssa Hansen, female    DOB: 1990-06-29, 32 y.o.   MRN: [id number]  HPI  MAGALI BRAY is a 32 y.o. year old female  who  has a past medical history of ADHD (attention deficit hyperactivity disorder), ADHD (attention deficit hyperactivity disorder), ADHD (attention deficit hyperactivity disorder), Anxiety, Bipolar 1 disorder, depressed (HCC), Chlamydia, Depression, Drug abuse (HCC), Herpes, History of suicidal tendencies, Hypothyroidism, Obesity, PCOS (polycystic ovarian syndrome), Restless legs, Scoliosis, Substance abuse (HCC), and Trichomonas.   They are presenting to PM&R clinic for follow up related to chronic pain related to lumbosacral back pain .  Marland Kitchen  Plan from last visit:  Midline low back pain with sciatica, sciatica laterality unspecified, unspecified chronicity Lumbar facet joint syndrome Spinal stenosis, lumbar region, with neurogenic claudication   Patient did have good temporary low back pain relief following L3, 4, 5 medial branch blocks with Dr. Wynn Banker on 8-1 24.  However, lasted less than 2 hours, and resulted in excruciating back pain for the following week.  Also, did not affect neuropathic symptoms in bilateral legs.   Discussed return to Dr. Wynn Banker for alternative injections versus neurosurgical referral for second opinion.  Patient opting for neurosurgical referral, placed today.   Chronic fatigue Obesity, morbid (HCC) Patient complains of persistent daytime fatigue despite compliance use of CPAP and adequate sleep 5 to 7 hours nightly.   Has failed outpatient weight loss management clinic with Ozempic and phentermine, advised patient to resume food diary to ensure accurate assessment of calorie intake.  Also discussed possible referral to bariatric surgery, however she does not wish to pursue this due to concerns about surgical risks.   I did discuss with her that, while weight loss will not reverse the arthritic changes in her back,  it will take significant wear off of her joints and I do think could improve her pain control and fatigue.     Chronic pain syndrome Recommended alternating Tylenol 1000 mg up to 3 times daily with ibuprofen 800 mg 3 times daily, which she is currently using for toothache temporarily.   Discussed increasing Lyrica to 225 mg twice daily, after discussion of risks and benefits patient wishes to defer at this time continue current 150 mg twice daily dosing.   Continue baclofen, which patient is benefiting from twice daily   Had extensive discussion with the patient regarding indications for opiate medication escalation, and providers opinion that this would not benefit her long-term.  We have discussed a few alternatives in the past such as Butrans patch and Subutex, which is ultimately not been able to transition to.  I discussed with her my pain management philosophy and offered to refer her to an alternative pain management clinic if she does not agree with this plan of care, patient wishes to continue with our office at this time understanding that opiates will not be dose escalated unless for specific temporary reasons such as postop or postintervention.   Had a long discussion with patient regarding inappropriate behaviors regarding frequent use of MyChart messaging; established boundaries for messaging only related to specific medication questions and to limit to once monthly unless provider give specific exception.  Patient is agreeable to this.     Interval Hx:  - Therapies: Does back stretching HEP intermittently but struggles just with basic ADLs  so this is limited.    - Follow ups: Dr. Wynn Banker:  9/20: She received 60% relief following lumbar medial branch blocks not meeting  the 80% criteria. I do think that some of her residual pain was actually at the L3-4 level and additional level would be needed to further assess this as a pain generator   - Per patient insurance would not approve  a second block    10/1: Left sacroiliac injection under fluoroscopic guidance  - Patient states cortisone makes her sick and this shot did that (causes migraine, diarrhea for several days); she has not had this issue with dexamethasone in the past.   - She states she got some pain relief with it but it worse off after about 45 minutes; more likely just from the lidocaine.    - Neurosurgical referral refused by practice    - Falls: none   - DME: she has a cane but tried not to use it   - Medications: Lyrica 150 mg twice daily filled 9/9 - she still feels significant benefit from this. Takes consistently. No side effects.   Oxycodone 10 mg Q4H PRN refilled 9/9 - she uses it consistently, decreases pain score by 1-2 points. She states she will take it before an exacerbating activity, and it will help reduce the time she needs to rest if she takes it. No constipation, no nausea, no vomitting.   Baclofen 10 mg TID PRN - still taking at least twice daily, sometimes 3 times; she notices a significant difference with it, especially at bedtime. It works best when she takes it with lyrica. No lethargy or constipation.   "None of them help out a whole lot." Time of day matters; lyrica and baclofen at bedtime help the most, in the AM oxycodone helps the most.   Still alternating tylenol and ibuprofen. She does not have the same acid reflux on ibuprofen that she did on meloxicam. 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   Stopped rixulti recently; is waiting to start a new medications. Working night shift form 6[employer] every night for the last week. 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   Pain Inventory Average Pain 6 Pain Right Now 5 My pain is constant, sharp, burning, stabbing, tingling, and aching  In the last 24 hours, has pain interfered with the following? General activity  8 Relation with others 8 Enjoyment of life 9 What TIME of day is your pain at its worst? morning , daytime, and evening Sleep (in general) Fair  Pain is worse with: walking, bending, sitting, inactivity, standing, and some activites Pain improves with: rest, heat/ice, medication, and TENS Relief from Meds: 4  Family History  Problem Relation Age of Onset   Hypertension Father    Hypertension Maternal Grandmother    Hypertension Maternal Grandfather    Heart disease Maternal Grandfather    Diabetes Maternal Grandfather    Alcohol abuse Neg Hx    Arthritis Neg Hx    Asthma Neg Hx    Birth defects Neg Hx    Cancer Neg Hx    Depression Neg Hx    COPD Neg Hx    Drug abuse Neg Hx    Early death Neg Hx    Hearing loss Neg Hx    Social History   Socioeconomic History   Marital status: Single    Spouse name: Not on file   Number of children: Not on file   Years of education: Not on file  Highest education level: Not on file  Occupational History   Not on file  Tobacco Use   Smoking status: Every Day    Current packs/day: 1.00    Types: Cigarettes    Passive exposure: Current   Smokeless tobacco: Never   Tobacco comments:    Smoke 1 pack per day//PAP 09/14/22  Vaping Use   Vaping status: Former  Substance and Sexual Activity   Alcohol use: No   Drug use: Yes    Types: Oxycodone, Cocaine, Marijuana    Comment: denies 11/08/20   Sexual activity: Not on file  Other Topics Concern   Not on file  Social History Narrative   Not on file   Social Determinants of Health   Financial Resource Strain: Not on file  Food Insecurity: Not on file  Transportation Needs: Not on file  Physical Activity: Not on file  Stress: Not on file  Social Connections: Unknown (11/09/2021)   Received from [employer], [facility]   Social Network    Social Network: Not on file   Past Surgical History:  Procedure Laterality Date   CESAREAN SECTION     LAPAROSCOPIC ABDOMINAL  EXPLORATION     Past Surgical History:  Procedure Laterality Date   CESAREAN SECTION     LAPAROSCOPIC ABDOMINAL EXPLORATION     Past Medical History:  Diagnosis Date   ADHD (attention deficit hyperactivity disorder)    ADHD (attention deficit hyperactivity disorder)    ADHD (attention deficit hyperactivity disorder)    Anxiety    Bipolar 1 disorder, depressed (HCC)    Chlamydia    Depression    Drug abuse (HCC)    Herpes    + blood test, never had outbreak   History of suicidal tendencies    Hypothyroidism    No meds.   Obesity    PCOS (polycystic ovarian syndrome)    Restless legs    Scoliosis    Substance abuse (HCC)    Trichomonas    BP (!) 150/81   Pulse 86   Ht 5\' 4"  (1.626 m)   Wt (!) 306 lb (138.8 kg)   SpO2 100%   BMI 52.52 kg/m   Opioid Risk Score:   Fall Risk Score:  `1  Depression screen [facility] 2/9     10/05/2022   10:45 AM 09/24/2022    2:31 PM 07/14/2022   12:00 PM 04/28/2022    1:54 PM 04/05/2022    9:31 AM 03/01/2022   10:43 AM  Depression screen PHQ 2/9  Decreased Interest 1 3 2  0 1 3  Down, Depressed, Hopeless 1 3 2  0 1 3  PHQ - 2 Score 2 6 4  0 2 6  Altered sleeping      2  Tired, decreased energy      3  Change in appetite      3  Feeling bad or failure about yourself       2  Trouble concentrating      1  Moving slowly or fidgety/restless      0  Suicidal thoughts      1  PHQ-9 Score      18     Review of Systems  Musculoskeletal:  Positive for back pain.       Pain in the back of right arm, bilateral legs front and back  All other systems reviewed and are negative.     Objective:   Physical Exam   PE: Constitution: Tearful, but in  no acute distress.  +Obese Resp: No respiratory distress. No accessory muscle usage. on RA and CTAB Cardio: Well perfused appearance. Trace peripheral edema. Abdomen: Nondistended. Nontender.   Psych: Tearful and depressed, flat. Unable to redirect conversation away from debilitating pain and  depression. No SI/HI.  Neuro: AAOx4. No apparent cognitive deficits    Neurologic Exam:   No babinski, no clonus  Sensory exam:  Intact RLE + Sensory loss LLE lateral proximal and mid-thigh  Motor exam: 5-/5 strength throughout BL LE in HF, abduction, adduciton, and knee extension Coordination: Fine motor coordination was normal.   Gait: +antalgic gait   MSK: + Exquisite TTP thorughout lumbar paraspinals, SI joints, PSISs Negative slump + Facet loading bilaterally, + TTP paraspinals     Assessment & Plan:   Alyssa Hansen is a 31 y.o. year old female  who  has a past medical history of ADHD (attention deficit hyperactivity disorder), ADHD (attention deficit hyperactivity disorder), ADHD (attention deficit hyperactivity disorder), Anxiety, Bipolar 1 disorder, depressed (HCC), Chlamydia, Depression, Drug abuse (HCC), Herpes, History of suicidal tendencies, Hypothyroidism, Obesity, PCOS (polycystic ovarian syndrome), Restless legs, Scoliosis, Substance abuse (HCC), and Trichomonas.   They are presenting to PM&R clinic for follow up related to chronic lumbosacral back pain .    Chronic pain syndrome Encounter for long-term use of opiate analgesic Encounter for medication monitoring -     ToxAssure Select,+Antidepr,UR Get your labs form [facility] and send them to me; if your renal function looks OK, we will start celebrex 100 mg daily to replace your ibuprofen.   Talk to your PCP about Tylenol use with your current liver issues (NAFLD prior, getting biopsy soon).  Continue current doses of oxycodone, lyrica, and baclofen. UDS today, results pending.  You will follow up with our NP Riley Lam every month and myself every 3 months from now on.   Chronic bilateral low back pain with left-sided sciatica  Lumbar MRI 08/04/22:  Within this limitation, there is mild-to-moderate spinal canal narrowing at L2-L3 and L3-L4. Mild bilateral neural foraminal narrowing at L2-L3.  Injections MBB with  Dr. Wynn Banker as above; 60% relief first block, insurance would not approve a second  Sacroiliac joint disease Injection with Dr. Wynn Banker 10/1 caused nasuea due to kenalog; can tolerate dexamethasone. Also only temporary releif. Would be reasonable to try again or hold off on further injections.   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 Work on sleeping in a comfortable, dark environment at the same time every day and minimize interruptions as able.

## 2022-10-13 NOTE — Telephone Encounter (Signed)
PA submitted for Oxycodone. PA Case ID #: [id number]

## 2022-10-14 MED ORDER — OXYCODONE HCL 10 MG PO TABS: 10.0000 mg | ORAL_TABLET | ORAL | 0 refills | Status: AC | PRN

## 2022-10-14 NOTE — Telephone Encounter (Signed)
Approved 10/13/2022 - 04/11/2023.

## 2022-10-18 LAB — TOXASSURE SELECT,+ANTIDEPR,UR

## 2022-10-20 ENCOUNTER — Encounter: Payer: Self-pay | Admitting: Physical Medicine and Rehabilitation

## 2022-10-20 ENCOUNTER — Telehealth: Payer: Self-pay | Admitting: Registered Nurse

## 2022-10-20 NOTE — Telephone Encounter (Signed)
Placed a call to Ms. Canlas,  UDS results was reviewed,  She states she hasn't uses Buprenorphine in years.  This information will be given to Dr Shearon Stalls, will ask Gillis Ends to call [facility] to see if they have the Urine sample and to run it again, and to see if the Toxicologist can review census from other patients on 10/13/2022.

## 2022-10-21 NOTE — Telephone Encounter (Signed)
I placed a call to [facility] toxicology and spoke with Jonny Ruiz. He verified that the buprenorphine is actually present and the metabolite norbuprenorphine is present as well but just under the cutoff.

## 2022-10-22 ENCOUNTER — Telehealth: Payer: Self-pay | Admitting: Registered Nurse

## 2022-10-22 NOTE — Telephone Encounter (Signed)
Sybil RN, spoke with toxicologist: see note for details  UDS + Buprenorphine. Spoke wih Dr Shearon Stalls regarding the above on Wednesday.  Alyssa Hansen will be discharged from our office.  On 10/14/2022 Her Oxycodone 10 mg/ 180 tablets was filled on 10/14/2022.  This provider called Alyssa Hansen to discuss her UDS results, Toxicology reports and she will be discharge from office. I asked her how many Oxycodone tablets she has she stated she has a lot, according to the PMP her Oxycodone was just filled on 10/14/2022. MME= 90.00 She should have 130 tablets She was given a slow weaning Scheduled  for Oxycodone :  Slow weaning of Oxycodone:  Take one tablet 5 times a day as needed for pain for one week ' Take one tablet 4 times a day as needed for one week  Take one tablet 3 times a day as needed for a week  Take one tablet 2 times a day as needed for a week.  Take one tablet daily as needed for a week    Take one tablet every other day as needed for pain for a week.   A discharge letter will be mailed to Alyssa Hansen, she was instructed to have her PCP place a referral to another Pain Management clinic, she verbalizes understanding.

## 2022-11-01 ENCOUNTER — Telehealth: Payer: Self-pay | Admitting: *Deleted

## 2022-11-01 NOTE — Telephone Encounter (Signed)
Discharge letter mailed through [employer] and sent via MyChart per Jacalyn Lefevre NP and Dr Shearon Stalls.

## 2022-11-12 ENCOUNTER — Encounter: Payer: Medicaid Other | Admitting: Registered Nurse

## 2022-11-30 ENCOUNTER — Telehealth: Payer: Self-pay | Admitting: Internal Medicine

## 2022-11-30 DIAGNOSIS — G4733 Obstructive sleep apnea (adult) (pediatric): Secondary | ICD-10-CM

## 2022-11-30 NOTE — Telephone Encounter (Signed)
Patient needs CPAP supplies, mainly mask, sent to [facility]. Hers broke. Please advise.

## 2022-12-01 NOTE — Telephone Encounter (Signed)
Referral for DME  to send mask placed.

## 2023-01-12 ENCOUNTER — Ambulatory Visit: Payer: Medicaid Other | Admitting: Physical Medicine and Rehabilitation

## 2023-11-15 NOTE — Progress Notes (Deleted)
 HPI F Smoker followed for OSA, complicated by  Hypothyroid, PCOS, Hx Opioid use, Hx Cocaine use, ADHD, Anxiety, BiPolar, Hx Suicidal Tendency, Asthma, NPSG 02/24/20 (per Dr Conny Su)  AHI 18.2/ hr, desaturation to 89%, CPAP to 15, body weight 283 lbs  ================================================================================   09/14/22- 32 yoF Smoker (1ppd/ followed for OSA, complicated by  Hypothyroid, PCOS, Hx Opioid use, Hx Cocaine use, ADHD, Anxiety, BiPolar, Hx Suicidal Tendency, Asthma, Obesity, Tobacco User,  -meds include synthroid, ventolin hfa, xanax 1 mg, naloxone, lamictal, adderall XR 30 CPAP auto 5-20/ [facility]   ordered 03/19/20 Download-her phone- compliance- 93%, AHI 0.4/hr Body weight today-314 lbs Patient states she never got SD card for cpap machine  Husband here.  Download reviewed.  She definitely feels better with CPAP but still admits occasionally feeling tired during the day.  She works as a comptroller.  Most days getting up around 6 AM and may not get to bed before 1 AM so we discussed adequate sleep and naps.  She drinks 2 cups of morning coffee.  Behavioral health provides Adderall 30 mg in the morning and she sometimes takes 15 mg in the afternoon if needed.  11/17/23- 33 yoF Smoker (1ppd/ followed for OSA, complicated by  Hypothyroid, PCOS, Hx Opioid use, Hx Cocaine use, ADHD, Anxiety, BiPolar, Hx Suicidal Tendency, Asthma, Obesity, Tobacco User,  -meds include synthroid, ventolin hfa, xanax 1 mg, naloxone, lamictal, adderall XR 30 CPAP auto 5-20/ [facility]   ordered 03/19/20 Download-her phone- compliance-  Body weight today-     ROS-see HPI   + = positive Constitutional:    weight loss, night sweats, fevers, chills, +fatigue, lassitude. HEENT:    headaches, difficulty swallowing, tooth/dental problems, sore throat,       sneezing, itching, ear ache, nasal congestion, post nasal drip, snoring CV:    chest pain, orthopnea, PND, swelling  in lower extremities, anasarca,                                   dizziness, palpitations Resp:   shortness of breath with exertion or at rest.                productive cough,   non-productive cough, coughing up of blood.              change in color of mucus.  wheezing.   Skin:    rash or lesions. GI:  No-   heartburn, indigestion, abdominal pain, nausea, vomiting, diarrhea,                 change in bowel habits, loss of appetite GU: dysuria, change in color of urine, no urgency or frequency.   flank pain. MS:   joint pain, stiffness, decreased range of motion, back pain. Neuro-     nothing unusual Psych:  change in mood or affect.  depression or anxiety.   memory loss.  OBJ- Physical Exam General- Alert, Oriented, Affect-appropriate, Distress- none acute, + obese Skin- +tatoo Lymphadenopathy- none Head- atraumatic        + stud in upper lip            Eyes- Gross vision intact, PERRLA, conjunctivae and secretions clear            Ears- Hearing, canals-normal            Nose- Clear, no-Septal dev, mucus, polyps, erosion, perforation  Throat- Mallampati IV , mucosa clear , drainage- none, tonsils+, + few missing teeth Neck- flexible , trachea midline, no stridor , thyroid nl, carotid no bruit Chest - symmetrical excursion , unlabored           Heart/CV- RRR , no murmur , no gallop  , no rub, nl s1 s2                           - JVD- none , edema- none, stasis changes- none, varices- none           Lung- clear to P&A, wheeze- none, cough- none , dullness-none, rub- none           Chest wall-  Abd-  Br/ Gen/ Rectal- Not done, not indicated Extrem- cyanosis- none, clubbing, none, atrophy- none, strength- nl Neuro- grossly intact to observation

## 2023-11-17 ENCOUNTER — Ambulatory Visit: Admitting: Internal Medicine

## 2023-11-17 ENCOUNTER — Encounter: Payer: Self-pay | Admitting: Internal Medicine
